# Patient Record
Sex: Female | Born: 1970 | Race: White | Hispanic: No | Smoking: Never smoker
Health system: Southern US, Community
[De-identification: ages and names within clinical notes are randomized; demographics above are authoritative.]

## PROBLEM LIST (undated history)

## (undated) DIAGNOSIS — K219 Gastro-esophageal reflux disease without esophagitis: Secondary | ICD-10-CM

## (undated) DIAGNOSIS — R519 Headache, unspecified: Secondary | ICD-10-CM

## (undated) DIAGNOSIS — J309 Allergic rhinitis, unspecified: Secondary | ICD-10-CM

## (undated) DIAGNOSIS — F32A Depression, unspecified: Secondary | ICD-10-CM

## (undated) DIAGNOSIS — Z9889 Other specified postprocedural states: Secondary | ICD-10-CM

## (undated) DIAGNOSIS — M25519 Pain in unspecified shoulder: Secondary | ICD-10-CM

## (undated) DIAGNOSIS — R5383 Other fatigue: Secondary | ICD-10-CM

## (undated) DIAGNOSIS — F419 Anxiety disorder, unspecified: Secondary | ICD-10-CM

## (undated) DIAGNOSIS — J302 Other seasonal allergic rhinitis: Secondary | ICD-10-CM

## (undated) DIAGNOSIS — E781 Pure hyperglyceridemia: Secondary | ICD-10-CM

## (undated) DIAGNOSIS — D649 Anemia, unspecified: Secondary | ICD-10-CM

## (undated) DIAGNOSIS — G4700 Insomnia, unspecified: Secondary | ICD-10-CM

## (undated) HISTORY — PX: OVARIAN CYST REMOVAL: SHX89

---

## 2008-04-28 ENCOUNTER — Ambulatory Visit: Payer: Self-pay

## 2010-10-17 ENCOUNTER — Ambulatory Visit: Payer: Self-pay | Admitting: Family Medicine

## 2011-11-09 ENCOUNTER — Ambulatory Visit: Payer: Self-pay | Admitting: Obstetrics and Gynecology

## 2011-11-14 ENCOUNTER — Inpatient Hospital Stay: Payer: Self-pay | Admitting: Obstetrics and Gynecology

## 2011-11-17 LAB — PATHOLOGY REPORT

## 2012-01-17 ENCOUNTER — Ambulatory Visit: Payer: Self-pay | Admitting: Obstetrics and Gynecology

## 2013-01-18 ENCOUNTER — Ambulatory Visit: Payer: Self-pay | Admitting: Obstetrics and Gynecology

## 2013-01-23 ENCOUNTER — Ambulatory Visit: Payer: Self-pay | Admitting: Obstetrics and Gynecology

## 2013-09-16 ENCOUNTER — Ambulatory Visit: Payer: Self-pay | Admitting: Specialist

## 2014-01-30 ENCOUNTER — Ambulatory Visit: Payer: Self-pay | Admitting: Family Medicine

## 2014-08-20 ENCOUNTER — Ambulatory Visit: Payer: Self-pay | Admitting: Family Medicine

## 2015-03-22 NOTE — Discharge Summary (Signed)
PATIENT NAME:  Teresa Rodriguez, Teresa Rodriguez MR#:  [id number] DATE OF BIRTH:  10-29-1971  DATE OF ADMISSION:  11/14/2011 DATE OF DISCHARGE:  11/17/2011   HOSPITAL COURSE: The patient underwent an exploratory laparotomy, right salpingo-oophorectomy for a dermoid cyst. Postoperatively, the patient did well. The patient was advanced to a regular diet on postoperative day one, was passing flatus on postoperative day one, ambulating. Postoperative day one hematocrit was 29.5%, BUN and creatinine of 6 and 0.7. The patient is discharged to home in good condition.   DISCHARGE MEDICATIONS:  1. Percocet 5/325, 1 to 2 tablets every 4 hours. 2. Colace 100 mg daily. 3. Naprosyn 500 mg b.i.d.     DISCHARGE INSTRUCTIONS: The patient will follow up with Dr. Ouida Sills in two weeks. Staples removed on discharge, Steri-Strips applied. The patient will return before two weeks if she has nausea, vomiting, increase in abdominal pain.   ____________________________ Boykin Nearing, MD tjs:cbb D: 11/17/2011 09:29:13 ET T: 11/17/2011 11:36:16 ET JOB#: [id number]  cc: Boykin Nearing, MD, <Dictator> Boykin Nearing MD ELECTRONICALLY SIGNED 11/29/2011 11:55

## 2016-06-17 ENCOUNTER — Other Ambulatory Visit: Payer: Self-pay | Admitting: Family Medicine

## 2016-06-17 DIAGNOSIS — Z1231 Encounter for screening mammogram for malignant neoplasm of breast: Secondary | ICD-10-CM

## 2016-06-22 ENCOUNTER — Ambulatory Visit
Admission: RE | Admit: 2016-06-22 | Discharge: 2016-06-22 | Disposition: A | Discharge status: Home / Self Care | Payer: BC Managed Care – PPO | Source: Ambulatory Visit | Attending: Family Medicine | Admitting: Family Medicine

## 2016-06-22 ENCOUNTER — Other Ambulatory Visit: Payer: Self-pay | Admitting: Family Medicine

## 2016-06-22 DIAGNOSIS — Z1231 Encounter for screening mammogram for malignant neoplasm of breast: Secondary | ICD-10-CM

## 2016-06-27 ENCOUNTER — Other Ambulatory Visit: Payer: Self-pay | Admitting: Family Medicine

## 2016-06-27 DIAGNOSIS — N6489 Other specified disorders of breast: Secondary | ICD-10-CM

## 2016-06-30 ENCOUNTER — Ambulatory Visit
Admission: RE | Admit: 2016-06-30 | Discharge: 2016-06-30 | Disposition: A | Discharge status: Home / Self Care | Payer: BC Managed Care – PPO | Source: Ambulatory Visit | Attending: Family Medicine | Admitting: Family Medicine

## 2016-06-30 DIAGNOSIS — N6489 Other specified disorders of breast: Secondary | ICD-10-CM

## 2017-11-15 ENCOUNTER — Other Ambulatory Visit: Payer: Self-pay | Admitting: Obstetrics and Gynecology

## 2017-11-15 DIAGNOSIS — N83292 Other ovarian cyst, left side: Secondary | ICD-10-CM

## 2017-11-22 ENCOUNTER — Ambulatory Visit
Admission: RE | Admit: 2017-11-22 | Discharge: 2017-11-22 | Disposition: A | Discharge status: Home / Self Care | Payer: BC Managed Care – PPO | Source: Ambulatory Visit | Attending: Obstetrics and Gynecology | Admitting: Obstetrics and Gynecology

## 2017-11-22 ENCOUNTER — Encounter: Payer: Self-pay | Admitting: Radiology

## 2017-11-22 DIAGNOSIS — D259 Leiomyoma of uterus, unspecified: Secondary | ICD-10-CM | POA: Insufficient documentation

## 2017-11-22 DIAGNOSIS — N83292 Other ovarian cyst, left side: Secondary | ICD-10-CM

## 2018-11-28 ENCOUNTER — Ambulatory Visit
Admission: EM | Admit: 2018-11-28 | Discharge: 2018-11-28 | Disposition: A | Discharge status: Home / Self Care | Payer: BC Managed Care – PPO | Attending: Family Medicine | Admitting: Family Medicine

## 2018-11-28 ENCOUNTER — Encounter: Payer: Self-pay | Admitting: Emergency Medicine

## 2018-11-28 ENCOUNTER — Other Ambulatory Visit: Payer: Self-pay

## 2018-11-28 DIAGNOSIS — H1132 Conjunctival hemorrhage, left eye: Secondary | ICD-10-CM | POA: Diagnosis not present

## 2018-11-28 HISTORY — DX: Pure hyperglyceridemia: E78.1

## 2018-11-28 HISTORY — DX: Other seasonal allergic rhinitis: J30.2

## 2018-11-28 NOTE — ED Triage Notes (Signed)
Patient in today c/o left eye being blood shot since this morning. Patient denies any crustiness of the eye, but does state that she has been around someone diagnosed with pink eye.

## 2018-11-28 NOTE — ED Provider Notes (Signed)
[facility]    CSN: [id number] Arrival date & time: 11/28/18  1027  History   Chief Complaint Chief Complaint  Patient presents with  . blood shot eye    left   HPI  48 year old female presents with the above complaint.  Patient woke up this morning and noticed redness of the left eye.  No crusting or discharge.  No visual changes.  No reports of pain.  Patient states that she has been around somebody with pinkeye and is got her concerned.  No medications or interventions tried.  No other associated symptoms.  No other complaints.  PMH, Surgical Hx, Family Hx, Social History reviewed and updated as below.  Past Medical History:  Diagnosis Date  . Hypertriglyceridemia   . Seasonal allergies    Past Surgical History:  Procedure Laterality Date  . OVARIAN CYST REMOVAL Right    OB History   No obstetric history on file.    Home Medications    Prior to Admission medications   Medication Sig Start Date End Date Taking? Authorizing Provider  Cetirizine HCl 10 MG CAPS Take by mouth.   Yes [provider]  clonazePAM (KLONOPIN) 0.5 MG tablet Take by mouth. 08/13/18  Yes [provider]  olopatadine (PATANOL) 0.1 % ophthalmic solution Apply to eye. 08/13/18 08/13/19 Yes [provider]   Family History Family History  Problem Relation Age of Onset  . Hyperlipidemia Mother   . Pancreatic cancer Father    Social History Social History   Tobacco Use  . Smoking status: Never Smoker  . Smokeless tobacco: Never Used  Substance Use Topics  . Alcohol use: Yes    Comment: occassional  . Drug use: Never   Allergies   Cyclobenzaprine   Review of Systems Review of Systems  Constitutional: Negative.   Eyes: Positive for redness. Negative for visual disturbance.   Physical Exam Triage Vital Signs ED Triage Vitals  Enc Vitals Group     BP 11/28/18 1010 100/67     Pulse Rate 11/28/18 1010 81     Resp 11/28/18 1010 16     Temp  11/28/18 1010 98 F (36.7 C)     Temp Source 11/28/18 1010 Oral     SpO2 11/28/18 1010 100 %     Weight 11/28/18 1011 140 lb (63.5 kg)     Height 11/28/18 1011 5\' 4"  (1.626 m)     Head Circumference --      Peak Flow --      Pain Score 11/28/18 1011 0     Pain Loc --      Pain Edu? --      Excl. in [place]? --    Updated Vital Signs BP 100/67 (BP Location: Right Arm)   Pulse 81   Temp 98 F (36.7 C) (Oral)   Resp 16   Ht 5\' 4"  (1.626 m)   Wt 63.5 kg   LMP 10/29/2018 (Approximate)   SpO2 100%   BMI 24.03 kg/m   Visual Acuity Right Eye Distance: 20/20(corrected) Left Eye Distance: 20/20(corrected) Bilateral Distance: 20/20(corrected)  Right Eye Near:   Left Eye Near:    Bilateral Near:     Physical Exam Constitutional:      General: She is not in acute distress. HENT:     Head: Normocephalic and atraumatic.     Nose: Nose normal.  Eyes:     General:        Right eye: No discharge.  Left eye: No discharge.     Comments: Left eye with subconjunctival hemorrhage.  No discharge.  Pulmonary:     Effort: Pulmonary effort is normal. No respiratory distress.  Neurological:     Mental Status: She is alert.  Psychiatric:        Mood and Affect: Mood normal.        Behavior: Behavior normal.    UC Treatments / Results  Labs (all labs ordered are listed, but only abnormal results are displayed) Labs Reviewed - No data to display  EKG None  Radiology No results found.  Procedures Procedures (including critical care time)  Medications Ordered in UC Medications - No data to display  Initial Impression / Assessment and Plan / UC Course  I have reviewed the triage vital signs and the nursing notes.  Pertinent labs & imaging results that were available during my care of the patient were reviewed by me and considered in my medical decision making (see chart for details).    48 year old female presents with subconjunctival hemorrhage.  Benign and self  resolving/self-limiting.  Supportive care.  Final Clinical Impressions(s) / UC Diagnoses   Final diagnoses:  Subconjunctival hemorrhage of left eye   Discharge Instructions   None    ED Prescriptions    None     Controlled Substance Prescriptions [place] Controlled Substance Registry consulted? Not Applicable   Coral Spikes, DO 11/28/18 1034

## 2019-09-23 ENCOUNTER — Other Ambulatory Visit: Payer: Self-pay | Admitting: Obstetrics and Gynecology

## 2019-09-23 DIAGNOSIS — Z1231 Encounter for screening mammogram for malignant neoplasm of breast: Secondary | ICD-10-CM

## 2019-12-19 ENCOUNTER — Inpatient Hospital Stay: Admission: RE | Admit: 2019-12-19 | Payer: BC Managed Care – PPO | Source: Ambulatory Visit

## 2020-02-04 ENCOUNTER — Other Ambulatory Visit: Payer: Self-pay

## 2020-02-04 ENCOUNTER — Ambulatory Visit
Admission: RE | Admit: 2020-02-04 | Discharge: 2020-02-04 | Disposition: A | Discharge status: Home / Self Care | Payer: Managed Care, Other (non HMO) | Source: Ambulatory Visit | Attending: Obstetrics and Gynecology | Admitting: Obstetrics and Gynecology

## 2020-02-04 DIAGNOSIS — Z1231 Encounter for screening mammogram for malignant neoplasm of breast: Secondary | ICD-10-CM

## 2020-06-28 DIAGNOSIS — U071 COVID-19: Secondary | ICD-10-CM

## 2020-06-28 HISTORY — DX: COVID-19: U07.1

## 2020-07-26 ENCOUNTER — Ambulatory Visit
Admission: EM | Admit: 2020-07-26 | Discharge: 2020-07-26 | Disposition: A | Discharge status: Home / Self Care | Payer: Managed Care, Other (non HMO) | Attending: Emergency Medicine | Admitting: Emergency Medicine

## 2020-07-26 ENCOUNTER — Other Ambulatory Visit: Payer: Self-pay

## 2020-07-26 DIAGNOSIS — R059 Cough, unspecified: Secondary | ICD-10-CM

## 2020-07-26 DIAGNOSIS — R05 Cough: Secondary | ICD-10-CM | POA: Diagnosis not present

## 2020-07-26 MED ORDER — BENZONATATE 100 MG PO CAPS: 100.0000 mg | ORAL_CAPSULE | Freq: Three times a day (TID) | ORAL | 0 refills | Status: AC

## 2020-07-26 NOTE — Discharge Instructions (Signed)
Take the [facility] as needed for your cough.    Follow up with your primary care provider if your symptoms are not improving.

## 2020-07-26 NOTE — ED Provider Notes (Signed)
[facility]    CSN: [id number] Arrival date & time: 07/26/20  1035      History   Chief Complaint Chief Complaint  Patient presents with  . Cough    HPI Teresa Rodriguez is a 49 y.o. female.   Patient presents with nonproductive cough and chest tightness when she takes a deep breath since being diagnosed with COVID on 07/05/2020.  She reports she has not had any fever since being diagnosed.  She denies weakness, ear pain, sore throat, congestion, chest pain, shortness of breath, abdominal pain, vomiting, diarrhea, rash, or other symptoms.  The history is provided by the patient.    Past Medical History:  Diagnosis Date  . Hypertriglyceridemia   . Seasonal allergies     There are no problems to display for this patient.   Past Surgical History:  Procedure Laterality Date  . OVARIAN CYST REMOVAL Right     OB History   No obstetric history on file.      Home Medications    Prior to Admission medications   Medication Sig Start Date End Date Taking? Authorizing Provider  Cetirizine HCl 10 MG CAPS Take by mouth.   Yes [provider]  clonazePAM (KLONOPIN) 0.5 MG tablet Take by mouth. 08/13/18  Yes [provider]  benzonatate (TESSALON) 100 MG capsule Take 1 capsule (100 mg total) by mouth every 8 (eight) hours. 07/26/20   Sharion Balloon, NP    Family History Family History  Problem Relation Age of Onset  . Hyperlipidemia Mother   . Pancreatic cancer Father   . Breast cancer Neg Hx     Social History Social History   Tobacco Use  . Smoking status: Never Smoker  . Smokeless tobacco: Never Used  Vaping Use  . Vaping Use: Never used  Substance Use Topics  . Alcohol use: Yes    Comment: occasional  . Drug use: Never     Allergies   Cyclobenzaprine   Review of Systems Review of Systems  Constitutional: Negative for chills and fever.  HENT: Negative for congestion, ear pain and sore throat.   Eyes: Negative for pain and  visual disturbance.  Respiratory: Positive for cough and chest tightness. Negative for shortness of breath.   Cardiovascular: Negative for chest pain and palpitations.  Gastrointestinal: Negative for abdominal pain, diarrhea and vomiting.  Genitourinary: Negative for dysuria and hematuria.  Musculoskeletal: Negative for arthralgias and back pain.  Skin: Negative for color change and rash.  Neurological: Negative for seizures and syncope.  All other systems reviewed and are negative.    Physical Exam Triage Vital Signs ED Triage Vitals  Enc Vitals Group     BP      Pulse      Resp      Temp      Temp src      SpO2      Weight      Height      Head Circumference      Peak Flow      Pain Score      Pain Loc      Pain Edu?      Excl. in [place]?    No data found.  Updated Vital Signs BP 132/76 (BP Location: Left Arm)   Pulse 84   Temp 98.6 F (37 C) (Oral)   Resp 18   Ht 5\' 4"  (1.626 m)   Wt 140 lb (63.5 kg)   LMP 07/26/2020  SpO2 100%   BMI 24.03 kg/m   Visual Acuity Right Eye Distance:   Left Eye Distance:   Bilateral Distance:    Right Eye Near:   Left Eye Near:    Bilateral Near:     Physical Exam Vitals and nursing note reviewed.  Constitutional:      General: She is not in acute distress.    Appearance: She is well-developed. She is not ill-appearing.  HENT:     Head: Normocephalic and atraumatic.     Right Ear: Tympanic membrane normal.     Left Ear: Tympanic membrane normal.     Nose: Nose normal.     Mouth/Throat:     Mouth: Mucous membranes are moist.     Pharynx: Oropharynx is clear.  Eyes:     Conjunctiva/sclera: Conjunctivae normal.  Cardiovascular:     Rate and Rhythm: Normal rate and regular rhythm.     Heart sounds: No murmur heard.   Pulmonary:     Effort: Pulmonary effort is normal. No respiratory distress.     Breath sounds: Normal breath sounds. No wheezing, rhonchi or rales.  Abdominal:     Palpations: Abdomen is soft.      Tenderness: There is no abdominal tenderness. There is no guarding or rebound.  Musculoskeletal:     Cervical back: Neck supple.  Skin:    General: Skin is warm and dry.     Findings: No rash.  Neurological:     General: No focal deficit present.     Mental Status: She is alert and oriented to person, place, and time.     Gait: Gait normal.  Psychiatric:        Mood and Affect: Mood normal.        Behavior: Behavior normal.      UC Treatments / Results  Labs (all labs ordered are listed, but only abnormal results are displayed) Labs Reviewed - No data to display  EKG   Radiology No results found.  Procedures Procedures (including critical care time)  Medications Ordered in UC Medications - No data to display  Initial Impression / Assessment and Plan / UC Course  I have reviewed the triage vital signs and the nursing notes.  Pertinent labs & imaging results that were available during my care of the patient were reviewed by me and considered in my medical decision making (see chart for details).   Cough.  Treating with Tessalon Perles.  Patient is well-appearing and her exam is reassuring.  Instructed patient to follow-up with her PCP if her symptoms are not improving.  Patient agrees to plan of care.   Final Clinical Impressions(s) / UC Diagnoses   Final diagnoses:  Cough     Discharge Instructions     Take the Tessalon Perles as needed for your cough.    Follow up with your primary care provider if your symptoms are not improving.       ED Prescriptions    Medication Sig Dispense Auth. Provider   benzonatate (TESSALON) 100 MG capsule Take 1 capsule (100 mg total) by mouth every 8 (eight) hours. 21 capsule Sharion Balloon, NP     PDMP not reviewed this encounter.   Sharion Balloon, NP 07/26/20 1201

## 2020-07-26 NOTE — ED Triage Notes (Signed)
Patient states that she was positive for covid on 8/8. States that she has since tested negative and has returned to work. Reports that she has continued to have a cough and feels like it has been worsening recently. States that it seems to be worse after walking.

## 2020-10-13 ENCOUNTER — Other Ambulatory Visit: Payer: Self-pay | Admitting: Orthopedic Surgery

## 2020-10-13 ENCOUNTER — Other Ambulatory Visit (HOSPITAL_COMMUNITY): Payer: Self-pay | Admitting: Orthopedic Surgery

## 2020-10-13 DIAGNOSIS — M25512 Pain in left shoulder: Secondary | ICD-10-CM

## 2020-10-13 DIAGNOSIS — G8929 Other chronic pain: Secondary | ICD-10-CM

## 2020-10-19 ENCOUNTER — Other Ambulatory Visit: Payer: Self-pay

## 2020-10-19 ENCOUNTER — Encounter: Payer: Self-pay | Admitting: Emergency Medicine

## 2020-10-19 ENCOUNTER — Ambulatory Visit
Admission: EM | Admit: 2020-10-19 | Discharge: 2020-10-19 | Disposition: A | Discharge status: Home / Self Care | Payer: Managed Care, Other (non HMO) | Attending: Physician Assistant | Admitting: Physician Assistant

## 2020-10-19 DIAGNOSIS — R682 Dry mouth, unspecified: Secondary | ICD-10-CM | POA: Diagnosis not present

## 2020-10-19 DIAGNOSIS — K148 Other diseases of tongue: Secondary | ICD-10-CM

## 2020-10-19 MED ORDER — NYSTATIN 100000 UNIT/ML MT SUSP: OROMUCOSAL | 0 refills | Status: DC

## 2020-10-19 NOTE — ED Provider Notes (Signed)
[facility]    CSN: [id number] Arrival date & time: 10/19/20  1314      History   Chief Complaint Chief Complaint  Patient presents with  . white on tongue  . 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  Patient says that her tongue does not hurt but it feels weird.  She says the looks like her tongue is white.  Patient states that before the symptoms started she had congestion and sore throat.  She says that her PCP treated her with cefdinir which she recently finished.  Patient admits to personal history of Covid a couple months ago and states that she fully recovered.  Patient denies any sore throat or other symptoms at this point.  No associated fever.  Patient denies any fatigue, body aches, chest pain or breathing difficulty.  No nausea or vomiting.  Patient says she feels well.  Past medical history significant for elevated triglycerides and iron deficiency anemia.  Patient says that her anemia is not that bad.  Patient denies use of corticosteroid inhalers.  Denies taking any corticosteroids.  Denies being immunocompromised.  Patient has no other concerns today.  HPI  Past Medical History:  Diagnosis Date  . Hypertriglyceridemia   . Seasonal allergies     There are no problems to display for this patient.   Past Surgical History:  Procedure Laterality Date  . OVARIAN CYST REMOVAL Right     OB History   No obstetric history on file.      Home Medications    Prior to Admission medications   Medication Sig Start Date End Date Taking? Authorizing Provider  Cetirizine HCl 10 MG CAPS Take by mouth.   Yes [provider]  clonazePAM (KLONOPIN) 0.5 MG tablet Take by mouth. 08/13/18  Yes [provider]  benzonatate (TESSALON) 100 MG capsule Take 1 capsule (100 mg total) by mouth every 8 (eight) hours. 07/26/20   Sharion Balloon,  NP  nystatin (MYCOSTATIN) 100000 UNIT/ML suspension Swish 5 mL in mouth and maintaining mouth as long as you can and then spit 4 times daily x10 days 10/19/20   Danton Clap, PA-C    Family History Family History  Problem Relation Age of Onset  . Hyperlipidemia Mother   . Pancreatic cancer Father   . Hypertension Father   . Breast cancer Neg Hx     Social History Social History   Tobacco Use  . Smoking status: Never Smoker  . Smokeless tobacco: Never Used  Vaping Use  . Vaping Use: Never used  Substance Use Topics  . Alcohol use: Yes    Comment: occasional  . Drug use: Never     Allergies   Cyclobenzaprine   Review of Systems Review of Systems  Constitutional: Negative for chills, diaphoresis, fatigue and fever.  HENT: Negative for congestion, facial swelling, rhinorrhea and sore throat.   Respiratory: Negative for cough and shortness of breath.   Cardiovascular: Negative for chest pain.  Gastrointestinal: Negative for abdominal pain, nausea and vomiting.  Musculoskeletal: Negative for arthralgias and myalgias.  Skin: Positive for color change. Negative for rash.  Neurological: Negative for weakness and headaches.  Hematological: Negative for adenopathy.     Physical Exam Triage Vital Signs ED Triage Vitals  Enc Vitals Group     BP 10/19/20 1326 (!) 118/56  Pulse Rate 10/19/20 1326 81     Resp 10/19/20 1326 18     Temp 10/19/20 1326 98.4 F (36.9 C)     Temp Source 10/19/20 1326 Oral     SpO2 10/19/20 1326 100 %     Weight 10/19/20 1325 148 lb (67.1 kg)     Height 10/19/20 1325 5\' 3"  (1.6 m)     Head Circumference --      Peak Flow --      Pain Score 10/19/20 1325 0     Pain Loc --      Pain Edu? --      Excl. in [place]? --    No data found.  Updated Vital Signs BP (!) 118/56 (BP Location: Left Arm)   Pulse 81   Temp 98.4 F (36.9 C) (Oral)   Resp 18   Ht 5\' 3"  (1.6 m)   Wt 148 lb (67.1 kg)   LMP 10/15/2020 (Approximate)   SpO2 100%    BMI 26.22 kg/m       Physical Exam Vitals and nursing note reviewed.  Constitutional:      General: She is not in acute distress.    Appearance: Normal appearance. She is not ill-appearing or toxic-appearing.  HENT:     Head: Normocephalic and atraumatic.     Nose: Nose normal.     Mouth/Throat:     Mouth: Mucous membranes are moist.     Pharynx: Oropharynx is clear.     Comments: Tongue is dry and pale.  No swelling noted.  Slight white coating on tongue. Eyes:     General: No scleral icterus.       Right eye: No discharge.        Left eye: No discharge.     Conjunctiva/sclera: Conjunctivae normal.  Cardiovascular:     Rate and Rhythm: Normal rate and regular rhythm.     Heart sounds: Normal heart sounds.  Pulmonary:     Effort: Pulmonary effort is normal. No respiratory distress.     Breath sounds: Normal breath sounds.  Musculoskeletal:     Cervical back: Neck supple.  Skin:    General: Skin is dry.  Neurological:     General: No focal deficit present.     Mental Status: She is alert. Mental status is at baseline.     Motor: No weakness.     Gait: Gait normal.  Psychiatric:        Mood and Affect: Mood normal.        Behavior: Behavior normal.        Thought Content: Thought content normal.      UC Treatments / Results  Labs (all labs ordered are listed, but only abnormal results are displayed) Labs Reviewed - No data to display  EKG   Radiology No results found.  Procedures Procedures (including critical care time)  Medications Ordered in UC Medications - No data to display  Initial Impression / Assessment and Plan / UC Course  I have reviewed the triage vital signs and the nursing notes.  Pertinent labs & imaging results that were available during my care of the patient were reviewed by me and considered in my medical decision making (see chart for details).    49 year old female presenting for discomfort of tongue with associated dry mouth and  discoloration of tongue.  On exam her tongue looks dry and has the feet white coating.  This does not appear to be exactly consistent with thrush  but that is patient's concern.  Advised her to increase fluids and suck on hard candies to stimulate her salivary glands.  Treating with nystatin suspension to see if that helps at this point.  Advised that if this does not get better she will need to follow-up with her PCP for further work-up including labs to assess for vitamin deficiencies and check her iron.  I would check her CBC, but she has no other symptoms and says that she feels well.  Final Clinical Impressions(s) / UC Diagnoses   Final diagnoses:  Dry mouth  Tongue discoloration     Discharge Instructions     Your tongue appears very dry and is pale.  You should increase your fluid intake.  Consider sucking on hard candies and sour candies to stimulate your salivary glands.  Consider use of humidifier as well.  As discussed, your condition does not look like thrush, recent antibiotic use can increase your risk of thrush.  Try nystatin mouthwash at this point to see if there is any fungal component to your tongue swelling and discoloration.  Follow-up with PCP if this is not getting better over the next week.  You may need to have certain labs checked to assess for vitamin deficiencies and have your iron level checked.    ED Prescriptions    Medication Sig Dispense Auth. Provider   nystatin (MYCOSTATIN) 100000 UNIT/ML suspension Swish 5 mL in mouth and maintaining mouth as long as you can and then spit 4 times daily x10 days 150 mL Danton Clap, PA-C     PDMP not reviewed this encounter.   Danton Clap, PA-C 10/19/20 1433

## 2020-10-19 NOTE — ED Triage Notes (Signed)
Patient in today c/o white on her tongue and dry mouth x 2 days. Patient finished Ceftin on 10/16/20. Patient had covid August 2021 and tested negative for flu, strep, and covid on ~10/07/20.

## 2020-10-19 NOTE — Discharge Instructions (Signed)
Your tongue appears very dry and is pale.  You should increase your fluid intake.  Consider sucking on hard candies and sour candies to stimulate your salivary glands.  Consider use of humidifier as well.  As discussed, your condition does not look like thrush, recent antibiotic use can increase your risk of thrush.  Try nystatin mouthwash at this point to see if there is any fungal component to your tongue swelling and discoloration.  Follow-up with PCP if this is not getting better over the next week.  You may need to have certain labs checked to assess for vitamin deficiencies and have your iron level checked.

## 2020-10-24 ENCOUNTER — Ambulatory Visit: Payer: Managed Care, Other (non HMO)

## 2020-11-12 ENCOUNTER — Ambulatory Visit
Admission: RE | Admit: 2020-11-12 | Discharge: 2020-11-12 | Disposition: A | Discharge status: Home / Self Care | Payer: Managed Care, Other (non HMO) | Source: Ambulatory Visit | Attending: Orthopedic Surgery | Admitting: Orthopedic Surgery

## 2020-11-12 ENCOUNTER — Other Ambulatory Visit: Payer: Self-pay

## 2020-11-12 DIAGNOSIS — M25512 Pain in left shoulder: Secondary | ICD-10-CM | POA: Diagnosis present

## 2020-11-23 ENCOUNTER — Other Ambulatory Visit: Payer: Self-pay | Admitting: Orthopedic Surgery

## 2020-12-02 ENCOUNTER — Encounter
Admission: RE | Admit: 2020-12-02 | Discharge: 2020-12-02 | Disposition: A | Discharge status: Home / Self Care | Payer: Managed Care, Other (non HMO) | Source: Ambulatory Visit | Attending: Orthopedic Surgery | Admitting: Orthopedic Surgery

## 2020-12-02 ENCOUNTER — Other Ambulatory Visit: Payer: Self-pay

## 2020-12-02 HISTORY — DX: Headache, unspecified: R51.9

## 2020-12-02 HISTORY — DX: Gastro-esophageal reflux disease without esophagitis: K21.9

## 2020-12-02 NOTE — Patient Instructions (Signed)
Your procedure is scheduled on: Monday 12/07/20.  Report to THE FIRST FLOOR REGISTRATION DESK IN THE MEDICAL MALL ON THE MORNING OF SURGERY FIRST, THEN YOU WILL CHECK IN AT THE SURGERY INFORMATION DESK LOCATED OUTSIDE THE SAME DAY SURGERY DEPARTMENT LOCATED ON 2ND FLOOR MEDICAL MALL ENTRANCE.  To find out your arrival time please call [phone] between 1PM - 3PM on Friday 12/04/20.   Remember: Instructions that are not followed completely may result in serious medical risk, up to and including death, or upon the discretion of your surgeon and anesthesiologist your surgery may need to be rescheduled.     __X__ 1. Do not eat food after midnight the night before your procedure.                 No gum chewing or hard candies. You may drink clear liquids up to 2 hours                 before you are scheduled to arrive for your surgery- DO NOT drink clear                 liquids within 2 hours of the start of your surgery.                 Clear Liquids include:  water, apple juice without pulp, clear carbohydrate                 drink such as Clearfast or Gatorade, Black Coffee or Tea (Do not add                 milk or creamer to coffee or tea).  **Dr. Allena Katz would like for you to finish the Pre-surgery Ensure 2 hours prior to your arrival time on the morning of surgery.**   __X__2.  On the morning of surgery brush your teeth with toothpaste and water, you may rinse your mouth with mouthwash if you wish.  Do not swallow any toothpaste or mouthwash.    __X__ 3.  No Alcohol for 24 hours before or after surgery.  __X__ 4.  Do Not Smoke or use e-cigarettes For 24 Hours Prior to Your Surgery.                 Do not use any chewable tobacco products for at least 6 hours prior to                 surgery.  __X__5.  Notify your doctor if there is any change in your medical condition      (cold, fever, infections).      Do NOT wear jewelry, make-up, hairpins, clips or nail polish. Do NOT wear  lotions, powders, or perfumes.  Do NOT shave 48 hours prior to surgery. Men may shave face and neck. Do NOT bring valuables to the hospital.     [facility] is not responsible for any belongings or valuables.   Contacts, dentures/partials or body piercings may not be worn into surgery. Bring a case for your contacts, glasses or hearing aids, a denture cup will be supplied.    Patients discharged the day of surgery will not be allowed to drive home.     __X__ Take these medicines the morning of surgery with A SIP OF WATER:     1. cetirizine (ZYRTEC)  2. clonazePAM (KLONOPIN) if needed     __X__ Use CHG Soap as directed.  __X__ Stop Anti-inflammatories 7 days before surgery such as Advil,  Ibuprofen, Motrin, BC or Goodies Powder, Naprosyn, Naproxen, Aleve, Aspirin, Meloxicam. May take Tylenol if needed for pain or discomfort.   __X__Do not start taking any new herbal supplements or vitamins prior to your procedure.     Wear comfortable clothing (specific to your surgery type) to the hospital.  Plan for stool softeners for home use; pain medications have a tendency to cause constipation. You can also help prevent constipation by eating foods high in fiber such as fruits and vegetables and drinking plenty of fluids as your diet allows.  After surgery, you can prevent lung complications by doing breathing exercises.Take deep breaths and cough every 1-2 hours. Your doctor may order a device called an Incentive Spirometer to help you take deep breaths.  Please call the [place] Department at [phone] if you have any questions about these instructions.

## 2020-12-03 ENCOUNTER — Encounter: Payer: Self-pay | Admitting: Orthopedic Surgery

## 2020-12-03 ENCOUNTER — Other Ambulatory Visit
Admission: RE | Admit: 2020-12-03 | Discharge: 2020-12-03 | Disposition: A | Discharge status: Home / Self Care | Payer: Managed Care, Other (non HMO) | Source: Ambulatory Visit | Attending: Orthopedic Surgery | Admitting: Orthopedic Surgery

## 2020-12-03 DIAGNOSIS — Z20822 Contact with and (suspected) exposure to covid-19: Secondary | ICD-10-CM | POA: Insufficient documentation

## 2020-12-03 DIAGNOSIS — Z01812 Encounter for preprocedural laboratory examination: Secondary | ICD-10-CM | POA: Diagnosis not present

## 2020-12-03 LAB — URINALYSIS, ROUTINE W REFLEX MICROSCOPIC
Bacteria, UA: NONE SEEN
Bilirubin Urine: NEGATIVE
Glucose, UA: NEGATIVE mg/dL
Ketones, ur: NEGATIVE mg/dL
Nitrite: NEGATIVE
Protein, ur: NEGATIVE mg/dL
Specific Gravity, Urine: 1.005 (ref 1.005–1.030)
pH: 5 (ref 5.0–8.0)

## 2020-12-03 LAB — COMPREHENSIVE METABOLIC PANEL
ALT: 18 U/L (ref 0–44)
AST: 19 U/L (ref 15–41)
Albumin: 4.1 g/dL (ref 3.5–5.0)
Alkaline Phosphatase: 56 U/L (ref 38–126)
Anion gap: 9 (ref 5–15)
BUN: 11 mg/dL (ref 6–20)
CO2: 25 mmol/L (ref 22–32)
Calcium: 9 mg/dL (ref 8.9–10.3)
Chloride: 104 mmol/L (ref 98–111)
Creatinine, Ser: 0.66 mg/dL (ref 0.44–1.00)
GFR, Estimated: >60 mL/min (ref >60)
Glucose, Bld: 76 mg/dL (ref 70–99)
Potassium: 3.5 mmol/L (ref 3.5–5.1)
Sodium: 138 mmol/L (ref 135–145)
Total Bilirubin: 0.8 mg/dL (ref 0.3–1.2)
Total Protein: 7.1 g/dL (ref 6.5–8.1)

## 2020-12-03 LAB — CBC WITH DIFFERENTIAL/PLATELET
Abs Immature Granulocytes: 0.01 10*3/uL (ref 0.00–0.07)
Basophils Absolute: 0 10*3/uL (ref 0.0–0.1)
Basophils Relative: 1 %
Eosinophils Absolute: 0.1 10*3/uL (ref 0.0–0.5)
Eosinophils Relative: 2 %
HCT: 36.1 % (ref 36.0–46.0)
Hemoglobin: 12 g/dL (ref 12.0–15.0)
Immature Granulocytes: 0 %
Lymphocytes Relative: 25 %
Lymphs Abs: 1.1 10*3/uL (ref 0.7–4.0)
MCH: 29.3 pg (ref 26.0–34.0)
MCHC: 33.2 g/dL (ref 30.0–36.0)
MCV: 88.3 fL (ref 80.0–100.0)
Monocytes Absolute: 0.5 10*3/uL (ref 0.1–1.0)
Monocytes Relative: 10 %
Neutro Abs: 2.7 10*3/uL (ref 1.7–7.7)
Neutrophils Relative %: 62 %
Platelets: 201 10*3/uL (ref 150–400)
RBC: 4.09 MIL/uL (ref 3.87–5.11)
RDW: 13 % (ref 11.5–15.5)
WBC: 4.4 10*3/uL (ref 4.0–10.5)
nRBC: 0 % (ref 0.0–0.2)

## 2020-12-03 LAB — SARS CORONAVIRUS 2 (TAT 6-24 HRS): SARS Coronavirus 2: NEGATIVE

## 2020-12-07 ENCOUNTER — Ambulatory Visit: Payer: Managed Care, Other (non HMO) | Admitting: Anesthesiology

## 2020-12-07 ENCOUNTER — Encounter: Payer: Self-pay | Admitting: Orthopedic Surgery

## 2020-12-07 ENCOUNTER — Ambulatory Visit
Admission: RE | Admit: 2020-12-07 | Discharge: 2020-12-07 | Disposition: A | Discharge status: Home / Self Care | Payer: Managed Care, Other (non HMO) | Attending: Orthopedic Surgery | Admitting: Orthopedic Surgery

## 2020-12-07 ENCOUNTER — Encounter
Admission: RE | Disposition: A | Discharge status: Home / Self Care | Payer: Self-pay | Source: Home / Self Care | Attending: Orthopedic Surgery

## 2020-12-07 ENCOUNTER — Ambulatory Visit: Payer: Managed Care, Other (non HMO)

## 2020-12-07 ENCOUNTER — Other Ambulatory Visit: Payer: Self-pay

## 2020-12-07 DIAGNOSIS — M7522 Bicipital tendinitis, left shoulder: Secondary | ICD-10-CM | POA: Insufficient documentation

## 2020-12-07 DIAGNOSIS — Z791 Long term (current) use of non-steroidal anti-inflammatories (NSAID): Secondary | ICD-10-CM | POA: Diagnosis not present

## 2020-12-07 DIAGNOSIS — Y92813 Airplane as the place of occurrence of the external cause: Secondary | ICD-10-CM | POA: Diagnosis not present

## 2020-12-07 DIAGNOSIS — Z8349 Family history of other endocrine, nutritional and metabolic diseases: Secondary | ICD-10-CM | POA: Insufficient documentation

## 2020-12-07 DIAGNOSIS — M7542 Impingement syndrome of left shoulder: Secondary | ICD-10-CM | POA: Diagnosis not present

## 2020-12-07 DIAGNOSIS — Z79899 Other long term (current) drug therapy: Secondary | ICD-10-CM | POA: Diagnosis not present

## 2020-12-07 DIAGNOSIS — M75122 Complete rotator cuff tear or rupture of left shoulder, not specified as traumatic: Secondary | ICD-10-CM | POA: Insufficient documentation

## 2020-12-07 DIAGNOSIS — Z800 Family history of malignant neoplasm of digestive organs: Secondary | ICD-10-CM | POA: Insufficient documentation

## 2020-12-07 DIAGNOSIS — M795 Residual foreign body in soft tissue: Secondary | ICD-10-CM

## 2020-12-07 DIAGNOSIS — Z419 Encounter for procedure for purposes other than remedying health state, unspecified: Secondary | ICD-10-CM

## 2020-12-07 DIAGNOSIS — S43492A Other sprain of left shoulder joint, initial encounter: Secondary | ICD-10-CM | POA: Insufficient documentation

## 2020-12-07 DIAGNOSIS — Z888 Allergy status to other drugs, medicaments and biological substances status: Secondary | ICD-10-CM | POA: Insufficient documentation

## 2020-12-07 DIAGNOSIS — X500XXA Overexertion from strenuous movement or load, initial encounter: Secondary | ICD-10-CM | POA: Insufficient documentation

## 2020-12-07 HISTORY — PX: SHOULDER ARTHROSCOPY WITH ROTATOR CUFF REPAIR AND SUBACROMIAL DECOMPRESSION: SHX5686

## 2020-12-07 LAB — POCT PREGNANCY, URINE: Preg Test, Ur: NEGATIVE

## 2020-12-07 SURGERY — SHOULDER ARTHROSCOPY WITH ROTATOR CUFF REPAIR AND SUBACROMIAL DECOMPRESSION
Anesthesia: General | Laterality: Left

## 2020-12-07 MED ORDER — BUPIVACAINE HCL (PF) 0.5 % IJ SOLN
INTRAMUSCULAR | Status: DC | PRN
  Administered 2020-12-07: 20 mL via PERINEURAL

## 2020-12-07 MED ORDER — FENTANYL CITRATE (PF) 100 MCG/2ML IJ SOLN
INTRAMUSCULAR | Status: DC | PRN
Start: 2020-12-07 — End: 2020-12-07
  Administered 2020-12-07: 100 ug via INTRAVENOUS
  Administered 2020-12-07: 25 ug via INTRAVENOUS

## 2020-12-07 MED ORDER — OXYCODONE HCL 5 MG PO TABS: 5.0000 mg | ORAL_TABLET | Freq: Once | ORAL | Status: DC | PRN

## 2020-12-07 MED ORDER — DEXAMETHASONE SODIUM PHOSPHATE 4 MG/ML IJ SOLN
INTRAMUSCULAR | Status: DC | PRN
  Administered 2020-12-07: 4 mg via INTRAVENOUS

## 2020-12-07 MED ORDER — MIDAZOLAM HCL 2 MG/2ML IJ SOLN
INTRAMUSCULAR | Status: DC | PRN
  Administered 2020-12-07: 2 mg via INTRAVENOUS

## 2020-12-07 MED ORDER — CEFAZOLIN SODIUM-DEXTROSE 2-4 GM/100ML-% IV SOLN
2.0000 g | INTRAVENOUS | Status: AC
  Administered 2020-12-07 (×2): 2 g via INTRAVENOUS

## 2020-12-07 MED ORDER — ONDANSETRON HCL 4 MG/2ML IJ SOLN
INTRAMUSCULAR | Status: DC | PRN
  Administered 2020-12-07: 4 mg via INTRAVENOUS

## 2020-12-07 MED ORDER — SCOPOLAMINE 1 MG/3DAYS TD PT72
1.0000 | MEDICATED_PATCH | Freq: Once | TRANSDERMAL | Status: DC
  Administered 2020-12-07: 1.5 mg via TRANSDERMAL

## 2020-12-07 MED ORDER — ONDANSETRON HCL 4 MG/2ML IJ SOLN
4.0000 mg | Freq: Once | INTRAMUSCULAR | Status: AC | PRN
  Administered 2020-12-07: 4 mg via INTRAVENOUS

## 2020-12-07 MED ORDER — PROPOFOL 10 MG/ML IV BOLUS
INTRAVENOUS | Status: DC | PRN
  Administered 2020-12-07: 150 mg via INTRAVENOUS

## 2020-12-07 MED ORDER — ASPIRIN EC 325 MG PO TBEC: 325.0000 mg | DELAYED_RELEASE_TABLET | Freq: Every day | ORAL | 0 refills | Status: AC

## 2020-12-07 MED ORDER — OXYCODONE HCL 5 MG PO TABS: 5.0000 mg | ORAL_TABLET | ORAL | 0 refills | Status: DC | PRN

## 2020-12-07 MED ORDER — ACETAMINOPHEN 160 MG/5ML PO SOLN: 325.0000 mg | ORAL | Status: DC | PRN

## 2020-12-07 MED ORDER — ONDANSETRON 4 MG PO TBDP: 4.0000 mg | ORAL_TABLET | Freq: Three times a day (TID) | ORAL | 0 refills | Status: AC | PRN

## 2020-12-07 MED ORDER — AMISULPRIDE (ANTIEMETIC) 5 MG/2ML IV SOLN
5.0000 mg | Freq: Once | INTRAVENOUS | Status: AC
  Administered 2020-12-07: 5 mg via INTRAVENOUS

## 2020-12-07 MED ORDER — BUPIVACAINE LIPOSOME 1.3 % IJ SUSP
INTRAMUSCULAR | Status: DC | PRN
  Administered 2020-12-07: 20 mL via PERINEURAL

## 2020-12-07 MED ORDER — SODIUM CHLORIDE 0.9 % IR SOLN
Status: DC | PRN
  Administered 2020-12-07 (×28): 3000 mL

## 2020-12-07 MED ORDER — FENTANYL CITRATE (PF) 100 MCG/2ML IJ SOLN
25.0000 ug | INTRAMUSCULAR | Status: DC | PRN
Start: 2020-12-07 — End: 2020-12-07

## 2020-12-07 MED ORDER — ACETAMINOPHEN 325 MG PO TABS: 325.0000 mg | ORAL_TABLET | ORAL | Status: DC | PRN

## 2020-12-07 MED ORDER — ACETAMINOPHEN 500 MG PO TABS: 1000.0000 mg | ORAL_TABLET | Freq: Three times a day (TID) | ORAL | 2 refills | Status: AC

## 2020-12-07 MED ORDER — OXYCODONE HCL 5 MG/5ML PO SOLN: 5.0000 mg | Freq: Once | ORAL | Status: DC | PRN

## 2020-12-07 MED ORDER — LACTATED RINGERS IV SOLN
INTRAVENOUS | Status: DC | PRN
  Administered 2020-12-07 (×4): 1 mL

## 2020-12-07 MED ORDER — LACTATED RINGERS IV SOLN: INTRAVENOUS | Status: DC

## 2020-12-07 SURGICAL SUPPLY — 70 items
ADAPTER IRRIG TUBE 2 SPIKE SOL (ADAPTER) ×4 IMPLANT
ADH SKN CLS APL DERMABOND .7 (GAUZE/BANDAGES/DRESSINGS)
ADPR TBG 2 SPK PMP STRL ASCP (ADAPTER) ×2
ANCH SUT 2 SWLK 19.1 CLS EYLT (Anchor) ×2 IMPLANT
ANCH SUT SHRT 12.5 CANN EYLT (Anchor) ×1 IMPLANT
ANCH SUT SWLK 19.1X4.75 (Anchor) ×1 IMPLANT
ANCH SUT SWLK 19.1X5.5 CLS (Anchor) ×1 IMPLANT
ANCHOR ICONIX SPEED 2.3 (Anchor) ×2 IMPLANT
ANCHOR SUT BIO SW 4.75X19.1 (Anchor) ×2 IMPLANT
ANCHOR SUT BIOCOMP LK 2.9X12.5 (Anchor) ×2 IMPLANT
ANCHOR SWIVELOCK BIO 4.75X19.1 (Anchor) ×4 IMPLANT
ANCHOR SWIVELOCK BIO COMP (Anchor) ×2 IMPLANT
APL PRP STRL LF DISP 70% ISPRP (MISCELLANEOUS) ×1
BUR BR 5.5 12 FLUTE (BURR) ×2 IMPLANT
BUR RADIUS 4.0X18.5 (BURR) ×2 IMPLANT
CANNULA PART THRD DISP 5.75X7 (CANNULA) ×2 IMPLANT
CANNULA PARTIAL THREAD 2X7 (CANNULA) ×2 IMPLANT
CHLORAPREP W/TINT 26 (MISCELLANEOUS) ×2 IMPLANT
COOLER POLAR GLACIER W/PUMP (MISCELLANEOUS) ×2 IMPLANT
COVER LIGHT HANDLE UNIVERSAL (MISCELLANEOUS) ×4 IMPLANT
DERMABOND ADVANCED (GAUZE/BANDAGES/DRESSINGS)
DERMABOND ADVANCED .7 DNX12 (GAUZE/BANDAGES/DRESSINGS) IMPLANT
DRAPE IMP U-DRAPE 54X76 (DRAPES) ×4 IMPLANT
DRAPE INCISE IOBAN 66X45 STRL (DRAPES) ×2 IMPLANT
DRAPE SHEET LG 3/4 BI-LAMINATE (DRAPES) ×2 IMPLANT
DRAPE U-SHAPE 48X52 POLY STRL (PACKS) ×4 IMPLANT
DRSG TEGADERM 4X10 (GAUZE/BANDAGES/DRESSINGS) ×2 IMPLANT
ELECT REM PT RETURN 9FT ADLT (ELECTROSURGICAL) ×2
ELECTRODE REM PT RTRN 9FT ADLT (ELECTROSURGICAL) ×1 IMPLANT
FIBER TAPE 2MM (SUTURE) ×2 IMPLANT
GAUZE SPONGE 4X4 12PLY STRL (GAUZE/BANDAGES/DRESSINGS) ×2 IMPLANT
GAUZE XEROFORM 1X8 LF (GAUZE/BANDAGES/DRESSINGS) ×2 IMPLANT
GLOVE BIOGEL PI IND STRL 8 (GLOVE) ×1 IMPLANT
GLOVE BIOGEL PI INDICATOR 8 (GLOVE) ×1
GOWN STRL REIN 2XL XLG LVL4 (GOWN DISPOSABLE) ×2 IMPLANT
GOWN STRL REUS W/ TWL LRG LVL3 (GOWN DISPOSABLE) ×1 IMPLANT
GOWN STRL REUS W/TWL LRG LVL3 (GOWN DISPOSABLE) ×2
IV LACTATED RINGER IRRG 3000ML (IV SOLUTION) ×60
IV LR IRRIG 3000ML ARTHROMATIC (IV SOLUTION) ×30 IMPLANT
KIT DISPOSABLE PUSHLOCK 2.9MM (KITS) ×2 IMPLANT
KIT STABILIZATION SHOULDER (MISCELLANEOUS) ×2 IMPLANT
KIT TURNOVER KIT A (KITS) ×2 IMPLANT
MANIFOLD 4PT FOR NEPTUNE1 (MISCELLANEOUS) ×2 IMPLANT
MASK FACE SPIDER DISP (MASK) ×2 IMPLANT
MAT ABSORB  FLUID 56X50 GRAY (MISCELLANEOUS) ×2
MAT ABSORB FLUID 56X50 GRAY (MISCELLANEOUS) ×2 IMPLANT
NDL SAFETY ECLIPSE 18X1.5 (NEEDLE) ×1 IMPLANT
NEEDLE HYPO 18GX1.5 SHARP (NEEDLE) ×2
NEEDLE SCORPION MULTI FIRE (NEEDLE) ×4 IMPLANT
PACK ARTHROSCOPY SHOULDER (MISCELLANEOUS) ×2 IMPLANT
PAD WRAPON POLAR SHDR UNIV (MISCELLANEOUS) ×1 IMPLANT
PASSER SUT SWIFTSTITCH HIP CRT (INSTRUMENTS) ×2 IMPLANT
PENCIL SMOKE EVACUATOR (MISCELLANEOUS) ×4 IMPLANT
SET TUBE SUCT SHAVER OUTFL 24K (TUBING) ×2 IMPLANT
SET TUBE TIP INTRA-ARTICULAR (MISCELLANEOUS) ×2 IMPLANT
SUT ETHILON 3-0 (SUTURE) ×2 IMPLANT
SUT FIBERSTICK 2-0 (SUTURE) ×2 IMPLANT
SUT MNCRL 4-0 (SUTURE) ×2
SUT MNCRL 4-0 27XMFL (SUTURE) ×1
SUT VIC AB 0 CT1 36 (SUTURE) ×2 IMPLANT
SUT VIC AB 2-0 CT2 27 (SUTURE) ×2 IMPLANT
SUTURE MNCRL 4-0 27XMF (SUTURE) ×1 IMPLANT
SUTURE TAPE 1.3 40 TPR END (SUTURE) ×3 IMPLANT
SUTURETAPE 1.3 40 TPR END (SUTURE) ×6
SYR 10ML LL (SYRINGE) ×2 IMPLANT
TAPE MICROFOAM 4IN (TAPE) ×2 IMPLANT
TUBING ARTHRO INFLOW-ONLY STRL (TUBING) ×2 IMPLANT
TUBING CONNECTING 10 (TUBING) ×2 IMPLANT
WAND WEREWOLF FLOW 90D (MISCELLANEOUS) ×2 IMPLANT
WRAPON POLAR PAD SHDR UNIV (MISCELLANEOUS) ×2

## 2020-12-07 NOTE — Anesthesia Procedure Notes (Signed)
Anesthesia Regional Block: Interscalene brachial plexus block   Pre-Anesthetic Checklist: ,, timeout performed, Correct Patient, Correct Site, Correct Laterality, Correct Procedure, Correct Position, site marked, Risks and benefits discussed,  Surgical consent,  Pre-op evaluation,  At surgeon's request and post-op pain management  Laterality: Left  Prep: chloraprep       Needles:  Injection technique: Single-shot  Needle Type: Stimiplex     Needle Length: 10cm  Needle Gauge: 21     Additional Needles:   Procedures:,,,, ultrasound used (permanent image in chart),,,,  Narrative:  Start time: 12/07/2020 9:42 AM End time: 12/07/2020 9:50 AM Injection made incrementally with aspirations every 5 mL.  Performed by: Personally  Anesthesiologist: Ronelle Nigh, MD  Additional Notes: Functioning IV was confirmed and monitors applied. Ultrasound guidance: relevant anatomy identified, needle position confirmed, local anesthetic spread visualized around nerve(s)., vascular puncture avoided.  Image printed for medical record.  Negative aspiration and no paresthesias; incremental administration of local anesthetic. The patient tolerated the procedure well. Vitals signes recorded in RN notes.

## 2020-12-07 NOTE — Anesthesia Postprocedure Evaluation (Signed)
Anesthesia Post Note  Patient: EYLEEN RAWLINSON  Procedure(s) Performed: Left shoulder mini-open rotator cuff repair, arthroscopic biceps tenodesis, and subacromial decompression (Left )     Patient location during evaluation: PACU Anesthesia Type: General Level of consciousness: awake and alert and oriented Pain management: satisfactory to patient Vital Signs Assessment: post-procedure vital signs reviewed and stable Respiratory status: spontaneous breathing, nonlabored ventilation and respiratory function stable Cardiovascular status: blood pressure returned to baseline and stable Postop Assessment: Adequate PO intake and No signs of nausea or vomiting Anesthetic complications: no   No complications documented.  Raliegh Ip

## 2020-12-07 NOTE — Op Note (Signed)
SURGERY DATE: 12/07/2020   PRE-OP DIAGNOSIS:  1. Left subacromial impingement 2. Left biceps tendinopathy 3. Left rotator cuff tear  POST-OP DIAGNOSIS: 1. Left subacromial impingement 2. Left biceps tendinopathy 3. Left rotator cuff tear  PROCEDURES:  1.  Left mini open rotator cuff repair 2.  Left arthroscopic biceps tenodesis 3.  Left subacromial decompression 4.  Left extensive debridement of shoulder (glenohumeral and subacromial spaces)  SURGEON: Rosealee AlbeeSunny H. Renda Pohlman, MD  ASSISTANT: Sonny DandyJ. Todd Mundy, PA  ANESTHESIA: Gen with interscalene block  ESTIMATED BLOOD LOSS: 25cc  DRAINS:  none  TOTAL IV FLUIDS: per anesthesia   SPECIMENS: none  IMPLANTS:   - Arthrex 2.659mm Pushlock - x1 - Arthrex 4.6875mm SwiveLock - x 2 - Arthrex 5.15mm SwiveLock - x 1   OPERATIVE FINDINGS:  Examination under anesthesia: A careful examination under anesthesia was performed.  Passive range of motion was: FF: 150; ER at side: 40; ER in abduction: 90; IR in abduction: 45.  Anterior load shift: NT.  Posterior load shift: NT.  Sulcus in neutral: NT.  Sulcus in ER: NT.    Intra-operative findings: A thorough arthroscopic examination of the shoulder was performed.  The findings are: 1. Biceps tendon: tendinopathy with significant erythema at the biceps anchor  2. Superior labrum: Normal 3. Posterior labrum and capsule: Small posterior labral tear at 9:00 with posterior capsule synovitis 4. Inferior capsule and inferior recess: normal 5. Glenoid cartilage surface: Normal  6. Supraspinatus attachment: High-grade bursal sided tear of the posterior supraspinatus and anterior infraspinatus 7. Posterior rotator cuff attachment: normal 8. Humeral head articular cartilage: normal 9. Rotator interval: Significant synovitis 10: Subscapularis tendon: attachment intact 11. Anterior labrum: Mild degeneration 12. IGHL: normal  OPERATIVE REPORT:   Indications for procedure: Teresa Rodriguez is a 50 y.o. female  who suffered an injury over 3 months ago when she was lifting overhead.  She has had significant pain and difficulty with overhead motion since that time.  Clinical exam and MRI was concerning for rotator cuff tear, biceps adenopathy and subacromial impingement. After discussion of risks, benefits, and alternatives to surgery, the patient elected to proceed.  Of note, we did discuss the increased risk of postoperative adhesive capsulitis given preoperative history of this.   Procedure in detail: I identified Teresa Rodriguez in the pre-operative holding area.  I marked the operative shoulder with my initials. I reviewed the risks and benefits of the proposed surgical intervention, and the patient (and/or patient's guardian) wished to proceed.  Anesthesia was then performed with an interscalene block with Exparel.  The patient was transferred to the operative suite and placed in the beach chair position.   SCDs were placed on the lower extremities. Appropriate IV antibiotics were administered within 1 hour before incision. The operative upper extremity was then prepped and draped in standard fashion. A time out was performed confirming the correct extremity, correct patient and correct procedure.   I then created a standard posterior portal with an 11 blade. The glenohumeral joint was easily entered with a blunt trochar and the arthroscope introduced. The findings of diagnostic arthroscopy are described above. I debrided degenerative tissue including labrum, posterior capsule, and rotator interval tissue.  I also debrided and coagulated the inflamed synovium to obtain hemostasis and reduce the risk of post-operative swelling using an Arthrocare radiofrequency device.   I then turned my attention to the arthroscopic biceps tenodesis.  I used the loop n tack technique to pass a fiber tape through the biceps in  a locked fashion adjacent to the biceps anchor.  A hole for a 2.9 mm Arthrex PushLock was drilled  in the bicipital groove just superior to the subscapularis tendon insertion.  The fiber tape was loaded onto the push lock anchor and impacted into place into the previously drilled hole in the bicipital groove.  However, and attempting to cut the sutures, the anchor pulled out of the bone.  Therefore, a new suture tape was passed through the biceps tendon and was attached in the bicipital groove using a 4.75 mm SwiveLock anchor.  This appropriately secured the biceps into the bicipital groove and took it off of tension.  Next, the arthroscope was then introduced into the subacromial space. A direct lateral portal was created with an 11-blade after spinal needle localization. An extensive subacromial bursectomy was performed using a combination of the shaver and Arthrocare wand. The entire acromial undersurface was exposed and the CA ligament was subperiosteally elevated to expose the anterior acromial hook. A burr was used to create a flat anterior and lateral aspect of the acromion, converting it from a Type 2 to a Type 1 acromion. Care was made to keep the deltoid fascia intact.  There is a high-grade bursal sided tear of the posterior supraspinatus and anterior infraspinatus.  There was no significant retracted tear of the infraspinatus itself as suggested on the MRI.  Given the high-grade nature of this tear, the tear was completed.  The greater tuberosity footprint was then cleared of soft tissue and bleeding surface was created using the burr to allow for improved healing.  4 strands of suture tape were passed through the rotator cuff and placed into a lateral row anchor (4.75 mm SwiveLock).  However, on tensioning the anterior sutures cut through the bone.  I then attempted to use the repair stitch to reapproximate the anterior portion of the torn tendon.  However, after passing this and tensioning it, the entire anchor pulled out of the bone.  At this point, decision was made to convert to a mini open  procedure.  A longitudinal incision from the anterolateral acromion ~6cm in length was made overlying the raphe between the anterior and middle heads of the deltoid. The raphe was identified and it was incised. The subacromial space was identified. Any remaining bursa was excised. The rotator cuff tear was identified. It was an L-shaped tear with the long limb of the L anterior.  Decision was made to perform a double row repair.  I first attempted to place an Iconix SPEED all soft tissue anchor at the articular margin, but this also pulled out of the bone without achieving fixation.  Therefore 2 strands of suture tape were loaded onto another 4.75 mm SwiveLock anchor.  This was inserted into the same hole just off the articular margin.  This achieved appropriate fixation.  While holding the rotator cuff in reduced position, all 6 strands (4 suture tape and 2 FiberWire) were passed through the rotator cuff.  On attempting to pass one of the strands, the needle tip from the Arthrex scorpion broke off.  This tip was not visualized so significant arthroscopic fluid was flushed through the joint using bulb irrigation as well as by placing the arthroscope into the posterior portal and thoroughly irrigating out the subacromial space as well as the glenohumeral joint.  Additionally, radiographs of the left shoulder were obtained with 2 separate views.  There was no retained needle tip noted.  All 6 strands of suture passed through the  rotator cuff were then loaded onto a 5.5 mm SwiveLock anchor.  This lateral row anchor was inserted more anteriorly and distally compared to prior lateral row anchor.  This achieved appropriate fixation with excellent reduction of the rotator cuff.  The rotator cuff was stable to external and internal rotation.  The wound was thoroughly irrigated.  The deltoid split was closed with 0 Vicryl.  The subdermal layer was closed with 2-0 Vicryl.  The skin was closed with staples. The portals  were closed with 3-0 Nylon. Xeroform was applied to the incisions. A sterile dressing was applied, followed by a Polar Care sleeve and a SlingShot shoulder immobilizer/sling. The patient was awakened from anesthesia without difficulty and was transferred to the PACU in stable condition.   Of note, assistance from a PA was essential to performing the surgery.  PA was present for the entire surgery.  PA assisted with patient positioning, retraction, instrumentation, and wound closure. The surgery would have been more difficult and had longer operative time without PA assistance.   Additionally, this mini open rotator cuff repair and arthroscopic biceps tenodesis both had additional complexity compared to standard repairs.  The arthroscopic biceps tenodesis required a second implant due to original implant pulling out of softer bone.  This added approximately 20 minutes to the surgical time.  The mini open rotator cuff repair was a conversion from an attempted arthroscopic rotator cuff repair.  This occurred due to loss of fixation because of the patient's underlying lower bone density.  This added approximately 1 hour to the surgical time.   COMPLICATIONS: none  DISPOSITION: plan for discharge home after recovery in PACU   POSTOPERATIVE PLAN: Remain in sling (except hygiene and elbow/wrist/hand RoM exercises as instructed by PT) x 6 weeks and NWB for this time. PT to begin 3-4 days after surgery. Rotator cuff repair and biceps tenodesis rehab protocol.

## 2020-12-07 NOTE — Discharge Instructions (Signed)
Post-Op Instructions - Rotator Cuff Repair  1. Bracing: You will wear a shoulder immobilizer or sling for 6 weeks.   2. Driving: No driving for 3 weeks post-op. When driving, do not wear the immobilizer. Ideally, we recommend no driving for 6 weeks while sling is in place as one arm will be immobilized.   3. Activity: No active lifting for 2 months. Wrist, hand, and elbow motion only. Avoid lifting the upper arm away from the body except for hygiene. You are permitted to bend and straighten the elbow passively only (no active elbow motion). You may use your hand and wrist for typing, writing, and managing utensils (cutting food). Do not lift more than a coffee cup for 8 weeks.  When sleeping or resting, inclined positions (recliner chair or wedge pillow) and a pillow under the forearm for support may provide better comfort for up to 4 weeks.  Avoid long distance travel for 4 weeks.  Return to normal activities after rotator cuff repair repair normally takes 6 months on average. If rehab goes very well, may be able to do most activities at 4 months, except overhead or contact sports.  4. Physical Therapy: Begins 3-4 days after surgery, and proceed 1 time per week for the first 6 weeks, then 1-2 times per week from weeks 6-20 post-op.  5. Medications:  - You will be provided a prescription for narcotic pain medicine. After surgery, take 1-2 narcotic tablets every 4 hours if needed for severe pain.  - A prescription for anti-nausea medication will be provided in case the narcotic medicine causes nausea - take 1 tablet every 6 hours only if nauseated.   - Take tylenol 1000 mg (2 Extra Strength tablets or 3 regular strength) every 8 hours for pain.  May decrease or stop tylenol 5 days after surgery if you are having minimal pain. - Take ASA 325mg/day x 2 weeks to help prevent DVTs/PEs (blood clots).  - DO NOT take ANY nonsteroidal anti-inflammatory pain medications (Advil, Motrin, Ibuprofen, Aleve,  Naproxen, or Naprosyn). These medicines can inhibit healing of your shoulder repair.    If you are taking prescription medication for anxiety, depression, insomnia, muscle spasm, chronic pain, or for attention deficit disorder, you are advised that you are at a higher risk of adverse effects with use of narcotics post-op, including narcotic addiction/dependence, depressed breathing, death. If you use non-prescribed substances: alcohol, marijuana, cocaine, heroin, methamphetamines, etc., you are at a higher risk of adverse effects with use of narcotics post-op, including narcotic addiction/dependence, depressed breathing, death. You are advised that taking > 50 morphine milligram equivalents (MME) of narcotic pain medication per day results in twice the risk of overdose or death. For your prescription provided: oxycodone 5 mg - taking more than 6 tablets per day would result in > 50 morphine milligram equivalents (MME) of narcotic pain medication. Be advised that we will prescribe narcotics short-term, for acute post-operative pain only - 3 weeks for major operations such as shoulder repair/reconstruction surgeries.     6. Post-Op Appointment:  Your first post-op appointment will be 10-14 days post-op.  7. Work or School: For most, but not all procedures, we advise staying out of work or school for at least 1 to 2 weeks in order to recover from the stress of surgery and to allow time for healing.   If you need a work or school note this can be provided.   8. Smoking: If you are a smoker, you need to refrain from   smoking in the postoperative period. The nicotine in cigarettes will inhibit healing of your shoulder repair and decrease the chance of successful repair. Similarly, nicotine containing products (gum, patches) should be avoided.   Post-operative Brace: Apply and remove the brace you received as you were instructed to at the time of fitting and as described in detail as the braces  instructions for use indicate.  Wear the brace for the period of time prescribed by your physician.  The brace can be cleaned with soap and water and allowed to air dry only.  Should the brace result in increased pain, decreased feeling (numbness/tingling), increased swelling or an overall worsening of your medical condition, please contact your doctor immediately.  If an emergency situation occurs as a result of wearing the brace after normal business hours, please dial 911 and seek immediate medical attention.  Let your doctor know if you have any further questions about the brace issued to you. Refer to the shoulder sling instructions for use if you have any questions regarding the correct fit of your shoulder sling.  [facility]: [phone]  Video that illustrates how to properly use a shoulder sling: "Instructions for Proper Use of an Orthopaedic Sling" [email or web address]         Information for Discharge Teaching: EXPAREL (bupivacaine liposome injectable suspension)   Your surgeon or anesthesiologist gave you EXPAREL(bupivacaine) to help control your pain after surgery.   EXPAREL is a local anesthetic that provides pain relief by numbing the tissue around the surgical site.  EXPAREL is designed to release pain medication over time and can control pain for up to 72 hours.  Depending on how you respond to EXPAREL, you may require less pain medication during your recovery.  Possible side effects:  Temporary loss of sensation or ability to move in the area where bupivacaine was injected.  Nausea, vomiting, constipation  Rarely, numbness and tingling in your mouth or lips, lightheadedness, or anxiety may occur.  Call your doctor right away if you think you may be experiencing any of these sensations, or if you have other questions regarding possible side effects.  Follow all other discharge instructions given to you by your  surgeon or nurse. Eat a healthy diet and drink plenty of water or other fluids.  If you return to the hospital for any reason within 96 hours following the administration of EXPAREL, it is important for health care providers to know that you have received this anesthetic. A teal colored band has been placed on your arm with the date, time and amount of EXPAREL you have received in order to alert and inform your health care providers. Please leave this armband in place for the full 96 hours following administration, and then you may remove the band.  Scopolamine skin patches REMOVE PATCH IN 72 HOURS AND [place] HANDS IMMEDIATELY What is this medicine? SCOPOLAMINE (skoe POL a meen) is used to prevent nausea and vomiting caused by motion sickness, anesthesia and surgery. This medicine may be used for other purposes; ask your health care provider or pharmacist if you have questions. COMMON BRAND NAME(S): Transderm Scop What should I tell my health care provider before I take this medicine? They need to know if you have any of these conditions:  are scheduled to have a gastric secretion test  glaucoma  heart disease  kidney disease  liver disease  lung or breathing disease, like asthma  mental illness  prostate disease  seizures  stomach or intestine problems  trouble passing urine  an unusual or allergic reaction to scopolamine, atropine, other medicines, foods, dyes, or preservatives  pregnant or trying to get pregnant  breast-feeding How should I use this medicine? This medicine is for external use only. Follow the directions on the prescription label. Wear only 1 patch at a time. Choose an area behind the ear, that is clean, dry, hairless and free from any cuts or irritation. Wipe the area with a clean dry tissue. Peel off the plastic backing of the skin patch, trying not to touch the adhesive side with your hands. Do not cut the patches. Firmly apply to the area you have chosen,  with the metallic side of the patch to the skin and the tan-colored side showing. Once firmly in place, wash your hands well with soap and water. Do not get this medicine into your eyes. After removing the patch, wash your hands and the area behind your ear thoroughly with soap and water. The patch will still contain some medicine after use. To avoid accidental contact or ingestion by children or pets, fold the used patch in half with the sticky side together and throw away in the trash out of the reach of children and pets. If you need to use a second patch after you remove the first, place it behind the other ear. A special MedGuide will be given to you by the pharmacist with each prescription and refill. Be sure to read this information carefully each time. Talk to your pediatrician regarding the use of this medicine in children. Special care may be needed. Overdosage: If you think you have taken too much of this medicine contact a poison control center or emergency room at once. NOTE: This medicine is only for you. Do not share this medicine with others. What if I miss a dose? This does not apply. This medicine is not for regular use. What may interact with this medicine?  alcohol  antihistamines for allergy cough and cold  atropine  certain medicines for anxiety or sleep  certain medicines for bladder problems like oxybutynin, tolterodine  certain medicines for depression like amitriptyline, fluoxetine, sertraline  certain medicines for stomach problems like dicyclomine, hyoscyamine  certain medicines for Parkinson's disease like benztropine, trihexyphenidyl  certain medicines for seizures like phenobarbital, primidone  general anesthetics like halothane, isoflurane, methoxyflurane, propofol  ipratropium  local anesthetics like lidocaine, pramoxine, tetracaine  medicines that relax muscles for surgery  phenothiazines like chlorpromazine, mesoridazine, prochlorperazine,  thioridazine  narcotic medicines for pain  other belladonna alkaloids This list may not describe all possible interactions. Give your health care provider a list of all the medicines, herbs, non-prescription drugs, or dietary supplements you use. Also tell them if you smoke, drink alcohol, or use illegal drugs. Some items may interact with your medicine. What should I watch for while using this medicine? Limit contact with water while swimming and bathing because the patch may fall off. If the patch falls off, throw it away and put a new one behind the other ear. You may get drowsy or dizzy. Do not drive, use machinery, or do anything that needs mental alertness until you know how this medicine affects you. Do not stand or sit up quickly, especially if you are an older patient. This reduces the risk of dizzy or fainting spells. Alcohol may interfere with the effect of this medicine. Avoid alcoholic drinks. Your mouth may get dry. Chewing sugarless gum or sucking hard candy, and drinking plenty of water may help. Contact  your healthcare professional if the problem does not go away or is severe. This medicine may cause dry eyes and blurred vision. If you wear contact lenses, you may feel some discomfort. Lubricating drops may help. See your healthcare professional if the problem does not go away or is severe. If you are going to need surgery, an MRI, CT scan, or other procedure, tell your healthcare professional that you are using this medicine. You may need to remove the patch before the procedure. What side effects may I notice from receiving this medicine? Side effects that you should report to your doctor or health care professional as soon as possible:  allergic reactions like skin rash, itching or hives; swelling of the face, lips, or tongue  blurred vision  changes in vision  confusion  dizziness  eye pain  fast, irregular heartbeat  hallucinations, loss of contact with  reality  nausea, vomiting  pain or trouble passing urine  restlessness  seizures  skin irritation  stomach pain Side effects that usually do not require medical attention (report to your doctor or health care professional if they continue or are bothersome):  drowsiness  dry mouth  headache  sore throat This list may not describe all possible side effects. Call your doctor for medical advice about side effects. You may report side effects to FDA at 1-800-FDA-1088. Where should I keep my medicine? Keep out of the reach of children. Store at room temperature between 20 and 25 degrees C (68 and 77 degrees F). Keep this medicine in the foil package until ready to use. Throw away any unused medicine after the expiration date. NOTE: This sheet is a summary. It may not cover all possible information. If you have questions about this medicine, talk to your doctor, pharmacist, or health care provider.  2021 Elsevier/Gold Standard (2018-02-02 16:14:46)  General Anesthesia, Adult, Care After This sheet gives you information about how to care for yourself after your procedure. Your health care provider may also give you more specific instructions. If you have problems or questions, contact your health care provider. What can I expect after the procedure? After the procedure, the following side effects are common:  Pain or discomfort at the IV site.  Nausea.  Vomiting.  Sore throat.  Trouble concentrating.  Feeling cold or chills.  Weak or tired.  Sleepiness and fatigue.  Soreness and body aches. These side effects can affect parts of the body that were not involved in surgery. Follow these instructions at home:  For at least 24 hours after the procedure:  Have a responsible adult stay with you. It is important to have someone help care for you until you are awake and alert.  Rest as needed.  Do not: ? Participate in activities in which you could fall or become  injured. ? Drive. ? Use heavy machinery. ? Drink alcohol. ? Take sleeping pills or medicines that cause drowsiness. ? Make important decisions or sign legal documents. ? Take care of children on your own. Eating and drinking  Follow any instructions from your health care provider about eating or drinking restrictions.  When you feel hungry, start by eating small amounts of foods that are soft and easy to digest (bland), such as toast. Gradually return to your regular diet.  Drink enough fluid to keep your urine pale yellow.  If you vomit, rehydrate by drinking water, juice, or clear broth. General instructions  If you have sleep apnea, surgery and certain medicines can increase your risk  for breathing problems. Follow instructions from your health care provider about wearing your sleep device: ? Anytime you are sleeping, including during daytime naps. ? While taking prescription pain medicines, sleeping medicines, or medicines that make you drowsy.  Return to your normal activities as told by your health care provider. Ask your health care provider what activities are safe for you.  Take over-the-counter and prescription medicines only as told by your health care provider.  If you smoke, do not smoke without supervision.  Keep all follow-up visits as told by your health care provider. This is important. Contact a health care provider if:  You have nausea or vomiting that does not get better with medicine.  You cannot eat or drink without vomiting.  You have pain that does not get better with medicine.  You are unable to pass urine.  You develop a skin rash.  You have a fever.  You have redness around your IV site that gets worse. Get help right away if:  You have difficulty breathing.  You have chest pain.  You have blood in your urine or stool, or you vomit blood. Summary  After the procedure, it is common to have a sore throat or nausea. It is also common to  feel tired.  Have a responsible adult stay with you for the first 24 hours after general anesthesia. It is important to have someone help care for you until you are awake and alert.  When you feel hungry, start by eating small amounts of foods that are soft and easy to digest (bland), such as toast. Gradually return to your regular diet.  Drink enough fluid to keep your urine pale yellow.  Return to your normal activities as told by your health care provider. Ask your health care provider what activities are safe for you. This information is not intended to replace advice given to you by your health care provider. Make sure you discuss any questions you have with your health care provider. Document Revised: 11/17/2017 Document Reviewed: 06/30/2017 Elsevier Patient Education  2020 [employer].

## 2020-12-07 NOTE — Transfer of Care (Signed)
Immediate Anesthesia Transfer of Care Note  Patient: Teresa Rodriguez  Procedure(s) Performed: Left shoulder arthroscopic vs mini-open rotator cuff repair, biceps tenodesis, and subacromial decompression (Left )  Patient Location: PACU  Anesthesia Type: General LMA  Level of Consciousness: awake, alert  and patient cooperative  Airway and Oxygen Therapy: Patient Spontanous Breathing and Patient connected to supplemental oxygen  Post-op Assessment: Post-op Vital signs reviewed, Patient's Cardiovascular Status Stable, Respiratory Function Stable, Patent Airway and No signs of Nausea or vomiting  Post-op Vital Signs: Reviewed and stable  Complications: No complications documented.

## 2020-12-07 NOTE — Anesthesia Procedure Notes (Signed)
Procedure Name: LMA Insertion Date/Time: 12/07/2020 11:35 AM Performed by: Cameron Ali, CRNA Pre-anesthesia Checklist: Patient identified, Emergency Drugs available, Suction available, Timeout performed and Patient being monitored Patient Re-evaluated:Patient Re-evaluated prior to induction Oxygen Delivery Method: Circle system utilized Preoxygenation: Pre-oxygenation with 100% oxygen Induction Type: IV induction LMA: LMA inserted LMA Size: 4.0 Number of attempts: 1 Placement Confirmation: positive ETCO2 and breath sounds checked- equal and bilateral Tube secured with: Tape Dental Injury: Teeth and Oropharynx as per pre-operative assessment

## 2020-12-07 NOTE — H&P (Signed)
Paper H&P to be scanned into permanent record. H&P reviewed. No significant changes noted.  

## 2020-12-07 NOTE — Progress Notes (Signed)
Assisted Mike Stella ANMD with left, ultrasound guided, interscalene block. Side rails up, monitors on throughout procedure. See vital signs in flow sheet. Tolerated Procedure well.   

## 2020-12-07 NOTE — Anesthesia Preprocedure Evaluation (Signed)
Anesthesia Evaluation  Patient identified by MRN, date of birth, ID band Patient awake    Reviewed: Allergy & Precautions, H&P , NPO status , Patient's Chart, lab work & pertinent test results  Airway Mallampati: II  TM Distance: >3 FB Neck ROM: full    Dental no notable dental hx.    Pulmonary    Pulmonary exam normal breath sounds clear to auscultation       Cardiovascular Normal cardiovascular exam Rhythm:regular Rate:Normal     Neuro/Psych    GI/Hepatic GERD  ,  Endo/Other    Renal/GU      Musculoskeletal   Abdominal   Peds  Hematology   Anesthesia Other Findings   Reproductive/Obstetrics                             Anesthesia Physical Anesthesia Plan  ASA: II  Anesthesia Plan: General LMA   Post-op Pain Management:  [facility] for Post-op pain   Induction:   PONV Risk Score and Plan: 3 and Treatment may vary due to age or medical condition, Ondansetron, Dexamethasone and Scopolamine patch - Pre-op  Airway Management Planned:   Additional Equipment:   Intra-op Plan:   Post-operative Plan:   Informed Consent: I have reviewed the patients History and Physical, chart, labs and discussed the procedure including the risks, benefits and alternatives for the proposed anesthesia with the patient or authorized representative who has indicated his/her understanding and acceptance.     Dental Advisory Given  Plan Discussed with: CRNA  Anesthesia Plan Comments:         Anesthesia Quick Evaluation

## 2020-12-08 ENCOUNTER — Encounter: Payer: Self-pay | Admitting: Orthopedic Surgery

## 2020-12-09 ENCOUNTER — Ambulatory Visit: Payer: Managed Care, Other (non HMO)

## 2021-02-02 ENCOUNTER — Other Ambulatory Visit: Payer: Self-pay | Admitting: Orthopedic Surgery

## 2021-02-02 NOTE — Progress Notes (Signed)
error 

## 2021-02-15 ENCOUNTER — Encounter: Payer: Self-pay | Admitting: Occupational Therapy

## 2021-02-15 ENCOUNTER — Other Ambulatory Visit: Payer: Self-pay

## 2021-02-15 ENCOUNTER — Ambulatory Visit: Payer: Managed Care, Other (non HMO) | Attending: Orthopedic Surgery | Admitting: Occupational Therapy

## 2021-02-15 DIAGNOSIS — M6281 Muscle weakness (generalized): Secondary | ICD-10-CM

## 2021-02-15 DIAGNOSIS — R600 Localized edema: Secondary | ICD-10-CM | POA: Diagnosis not present

## 2021-02-15 DIAGNOSIS — M25632 Stiffness of left wrist, not elsewhere classified: Secondary | ICD-10-CM

## 2021-02-15 DIAGNOSIS — M25642 Stiffness of left hand, not elsewhere classified: Secondary | ICD-10-CM

## 2021-02-15 NOTE — Therapy (Signed)
[facility] [address], [place], [address] Phone: [phone]   Fax:  [phone]  Occupational Therapy Evaluation  Patient Details  Name: Teresa Rodriguez MRN: [id number] Date of Birth: 09-Sep-1971 Referring Provider (OT): Dr Posey Pronto   Encounter Date: 02/15/2021   OT End of Session - 02/15/21 1543    Visit Number 1    Number of Visits 8    Date for OT Re-Evaluation 03/29/21    OT Start Time 1415    OT Stop Time 1522    OT Time Calculation (min) 67 min    Activity Tolerance Patient tolerated treatment well    Behavior During Therapy [facility] for tasks assessed/performed           Past Medical History:  Diagnosis Date  . COVID-19 06/2020  . GERD (gastroesophageal reflux disease)   . Headache   . Hypertriglyceridemia   . Seasonal allergies     Past Surgical History:  Procedure Laterality Date  . OVARIAN CYST REMOVAL Right   . SHOULDER ARTHROSCOPY WITH ROTATOR CUFF REPAIR AND SUBACROMIAL DECOMPRESSION Left 12/07/2020   Procedure: Left shoulder mini-open rotator cuff repair, arthroscopic biceps tenodesis, and subacromial decompression;  Surgeon: Leim Fabry, MD;  Location: [place];  Service: Orthopedics;  Laterality: Left;  Reche Dixon to Assist    There were no vitals filed for this visit.   Subjective Assessment - 02/15/21 1531    Subjective  I was first in sling for about 6 wks , did not notice my arm swelling - but after that - and I cannot support or hold my arm really up to use it- my arm just feel heavy, tight , stiff and my thumb is sore    Pertinent History DR Posey Pronto did left shoulder arthroscopy 12/07/20     12/07/20:  PROCEDURES:   1. Left mini open rotator cuff repair  2. Left arthroscopic biceps tenodesis  3. Left subacromial decompression  4. Left extensive debridement of shoulder (glenohumeral and subacromial spaces)    History of Present Illness:  02/02/2021:  She was on 02/01/21  approximately 8 weeks after undergoing above procedures. She returns to the office earlier than anticipated due to persistent left hand and forearm swelling and difficulty using her fingers. She also notes occasional color changes if her hand is in a dependent position for prolonged period of time. She also notes that the left hand feels warmer than the right hand frequently. She does not have exquisite pain in her hands although there are certain areas that are more tender than what she would expect - refer to hand therapy - ? CRPS    Patient Stated Goals Want to get the swelling and heavy feeling better so I can use my hand    Currently in Pain? Yes    Pain Score 4     Pain Location Shoulder    Pain Orientation Left    Pain Descriptors / Indicators Aching    Pain Type Surgical pain    Pain Onset More than a month ago    Pain Frequency Intermittent             [facility] OT Assessment - 02/15/21 0001      Assessment   Medical Diagnosis Edema L UE/CRPS    Referring Provider (OT) Dr Posey Pronto    Onset Date/Surgical Date 12/07/20    Hand Dominance Right    Prior Therapy --   PT for L shoulder at  [facility]     Precautions   Precautions --   RTC repair precautions     Home  Environment   Lives With Family   mom, daughter and grand daughter 17 months     Prior Function   Vocation Full time employment    Leisure Civil engineer, contracting, likes to reach, Haematologist, house work ,has Higher education careers adviser at [employer]      AROM   Left Forearm Pronation 90 Degrees    Left Forearm Supination 70 Degrees    Left Wrist Extension 35 Degrees    Left Wrist Flexion 50 Degrees    Left Wrist Radial Deviation 15 Degrees    Left Wrist Ulnar Deviation 25 Degrees      Left Hand AROM   L Thumb Opposition to Index --   opposition- pain in thumb MC to 4th digit   L Index  MCP 0-90 60 Degrees    L Index PIP 0-100 90 Degrees    L Long  MCP 0-90 60 Degrees    L Long PIP 0-100 90 Degrees    L Ring  MCP 0-90 60 Degrees     L Ring PIP 0-100 90 Degrees    L Little  MCP 0-90 80 Degrees    L Little PIP 0-100 95 Degrees            LYMPHEDEMA/ONCOLOGY QUESTIONNAIRE - 02/15/21 0001      Right Upper Extremity Lymphedema   15 cm Proximal to Olecranon Process 29 cm    10 cm Proximal to Olecranon Process 26.6 cm    Olecranon Process 25.5 cm    15 cm Proximal to Ulnar Styloid Process 25 cm    10 cm Proximal to Ulnar Styloid Process 21.5 cm    Just Proximal to Ulnar Styloid Process 16.8 cm    Across Hand at [employer] 19 cm    At [place] of 2nd Digit 6.4 cm    At [facility] of Thumb 6 cm      Left Upper Extremity Lymphedema   15 cm Proximal to Olecranon Process 29 cm    10 cm Proximal to Olecranon Process 27 cm    Olecranon Process 26.4 cm    15 cm Proximal to Ulnar Styloid Process 26.3 cm    10 cm Proximal to Ulnar Styloid Process 23.4 cm    Just Proximal to Ulnar Styloid Process 19 cm    Across Hand at [employer] 19.4 cm    At [place] of 2nd Digit 7 cm    At [facility] of Thumb 6.4 cm             Manual Lymph Drainage  Do manual lymph drainage once each day to help decrease swelling.  This should take you about 15 minutes depending on the size of your limb.  For Left Arm:  1. In supine- OT held L arm at 90 degrees shoulder flexion -family to hold and help with her MLD 3. Pump across chest from left to right 8 times 4. Pump down the left side of trunk from armpit to groin 8 times 7. 5. Pump up the outside of left upper arm 8 times  8. . Pump top of forearm from wrist to elbow 8 times 9. Pump up the outside of left upper arm 8 times 10.       Pump across chest from left to right 8 times 11. Pump down the left side of trunk from armpit to groin 8 times  12. Pump up the back of the forearm from wrist to elbow 8 times 13. Pump up the outside of left upper arm 8 times 14. Pump across chest from left to right 8 times 15. Pump down the left side of trunk from armpit to groin 4-5 times \ LOW and LIGHT  with only your palm NOT FINGERTIPS   AROM for tendon glides -and opposition to all digits Pain less than 2/10 and only AROM no putty or squeezing ball  Fitted with medium isotoner glove and tubigrip D from hand to elbow -and then for daytime can add tubigrip E from forearm to axilla- making sure no tourniquet on upper arm              OT Education - 02/15/21 1542    Education Details Findings of eval and HEP    Person(s) Educated Patient    Methods Explanation;Demonstration;Tactile cues;Verbal cues;Handout    Comprehension Verbal cues required;Returned demonstration;Verbalized understanding            OT Short Term Goals - 02/15/21 1552      OT SHORT TERM GOAL #1   Title Pt to be independent in MLD and wearing of compression to decrease L UE circumference by 1 cm in wrist and forearm and 0.5 cm in upperarm and hand to show increase digits and wrist AROM    Baseline Circumference increase in L  hand and digits by 0.6 cm, wrist and forearm 2.2cm, elbow 0.9 cm and upper arm 0.4 cm - AROM in digits and wrist decrease see flowsheet    Time 3    Period Weeks    Status New    Target Date 03/08/21             OT Long Term Goals - 02/15/21 1555      OT LONG TERM GOAL #1   Title Pt edema in L UE decrease to less than 0.5 from digits to upper arm to be able to use hand in gripping ADL's objects, utencils and increase ease  on computer    Baseline increase from digits to upperarm - compare to R UE - see STG - not able to hold or girp objects - one finger at time on computer    Time 4    Period Weeks    Status New    Target Date 03/15/21      OT LONG TERM GOAL #2   Title L digits and wrist AROM increase to WNL to grip toys to  play with granddaugther ,  grip objects in kitchen at waist height    Baseline unable to use L hand to play or use at home- stiffness in wrist and digits - Wrist ext 35, flexion 50 , UD 25, RD 15 - MC 60-80's and PIP's 90-95's    Time 6    Period  Weeks    Status New    Target Date 03/29/21      OT LONG TERM GOAL #3   Title Initiate grip strength and prehension in L hand    Baseline Only edema control and AROM at this time in L hand -    Time 4    Period Weeks    Status New    Target Date 03/15/21      OT LONG TERM GOAL #4   Title L elbow AROM increase to WNL to use on computer and use with bathing and dressing    Baseline feel pull  -cannot keep all  the way extended the L elbow, supination decrease - cannot use on computer - only one finger at time- cannot use for bathing and dressing little    Time 5    Period Weeks    Status New    Target Date 03/22/21                 Plan - 02/15/21 1545    Clinical Impression Statement Pt present at OT eval 10 wks s/p from L shoulder arthroscopy - pt with increase edema in L UE - with digits and hand increase 0.6 cm, wrist 2.2cm, forearm 1.9 cm , elbow 1.3 cm and upper arm 0.9 cm compare to R  -  limiting her L elbow, wrist and digits AROM - in all planes- pain mostly in L thumb 2/10 with AROM - and 4/10 in shoulder- pt limited with use of L UE in ADL's and ADL's- pt denies pain or tenderness -mostly - full , heavy and tight/stiff feeling in L arm - pt ed on MLD , fitted with compression isotoner glove and tubi grip D and E for daytime -and to take off E at night time- family to assist her with MLD -and AROM for digits and wrist - pain free pt can benefit from OT services    OT Occupational Profile and History Problem Focused Assessment - Including review of records relating to presenting problem    Occupational performance deficits (Please refer to evaluation for details): ADL's;IADL's;Rest and Sleep;Work;Play;Leisure;Social Participation    Body Structure / Function / Physical Skills ADL;Coordination;IADL;Pain;Strength;Edema;Dexterity;FMC;Flexibility;ROM;UE functional use    Rehab Potential Good    Clinical Decision Making Several treatment options, min-mod task modification necessary     Comorbidities Affecting Occupational Performance: None    Modification or Assistance to Complete Evaluation  Min-Moderate modification of tasks or assist with assess necessary to complete eval    OT Frequency 2x / week   2 x wk for 2 wks , 1 x wk   OT Duration 6 weeks    OT Treatment/Interventions Self-care/ADL training;Contrast Bath;Therapeutic exercise;Manual lymph drainage;Patient/family education;Splinting;Compression bandaging;Manual Therapy;Passive range of motion    Consulted and Agree with Plan of Care Patient           Patient will benefit from skilled therapeutic intervention in order to improve the following deficits and impairments:   Body Structure / Function / Physical Skills: ADL,Coordination,IADL,Pain,Strength,Edema,Dexterity,FMC,Flexibility,ROM,UE functional use       Visit Diagnosis: Localized edema - Plan: Ot plan of care cert/re-cert  Stiffness of left wrist, not elsewhere classified - Plan: Ot plan of care cert/re-cert  Stiffness of left hand, not elsewhere classified - Plan: Ot plan of care cert/re-cert  Muscle weakness (generalized) - Plan: Ot plan of care cert/re-cert    Problem List There are no problems to display for this patient.   Rosalyn Gess  OTR/L,CLT 02/15/2021, 5:23 PM  [facility] [address] [place], [address] Phone: [phone]   Fax:  [phone]  Name: Teresa Rodriguez MRN: [id number] Date of Birth: 06/21/1971

## 2021-02-22 ENCOUNTER — Other Ambulatory Visit: Payer: Self-pay

## 2021-02-22 ENCOUNTER — Ambulatory Visit: Payer: Managed Care, Other (non HMO) | Admitting: Occupational Therapy

## 2021-02-22 DIAGNOSIS — M25642 Stiffness of left hand, not elsewhere classified: Secondary | ICD-10-CM

## 2021-02-22 DIAGNOSIS — M25632 Stiffness of left wrist, not elsewhere classified: Secondary | ICD-10-CM

## 2021-02-22 DIAGNOSIS — M6281 Muscle weakness (generalized): Secondary | ICD-10-CM

## 2021-02-22 DIAGNOSIS — R600 Localized edema: Secondary | ICD-10-CM

## 2021-02-22 NOTE — Therapy (Signed)
[facility] [address], [place], [address] Phone: [phone]   Fax:  [phone]  Occupational Therapy Treatment  Patient Details  Name: Teresa Rodriguez MRN: [id number] Date of Birth: 01/07/1971 Referring Provider (OT): Dr Posey Pronto   Encounter Date: 02/22/2021   OT End of Session - 02/22/21 2003    Visit Number 2    Number of Visits 8    Date for OT Re-Evaluation 03/29/21    OT Start Time 1430    OT Stop Time 1514    OT Time Calculation (min) 44 min    Activity Tolerance Patient tolerated treatment well    Behavior During Therapy [facility] for tasks assessed/performed           Past Medical History:  Diagnosis Date  . COVID-19 06/2020  . GERD (gastroesophageal reflux disease)   . Headache   . Hypertriglyceridemia   . Seasonal allergies     Past Surgical History:  Procedure Laterality Date  . OVARIAN CYST REMOVAL Right   . SHOULDER ARTHROSCOPY WITH ROTATOR CUFF REPAIR AND SUBACROMIAL DECOMPRESSION Left 12/07/2020   Procedure: Left shoulder mini-open rotator cuff repair, arthroscopic biceps tenodesis, and subacromial decompression;  Surgeon: Leim Fabry, MD;  Location: [facility];  Service: Orthopedics;  Laterality: Left;  Reche Dixon to Assist    There were no vitals filed for this visit.   Subjective Assessment - 02/22/21 2001    Subjective  Better- I can tell the swelling in my hand and forearm better- and more motion but my fingers are stiff- still very hard to use my hand because of my shoulder    Pertinent History DR Posey Pronto did left shoulder arthroscopy 12/07/20     12/07/20:  PROCEDURES:   1. Left mini open rotator cuff repair  2. Left arthroscopic biceps tenodesis  3. Left subacromial decompression  4. Left extensive debridement of shoulder (glenohumeral and subacromial spaces)    History of Present Illness:  02/02/2021:  She was on 02/01/21 approximately 8 weeks after undergoing above  procedures. She returns to the office earlier than anticipated due to persistent left hand and forearm swelling and difficulty using her fingers. She also notes occasional color changes if her hand is in a dependent position for prolonged period of time. She also notes that the left hand feels warmer than the right hand frequently. She does not have exquisite pain in her hands although there are certain areas that are more tender than what she would expect - refer to hand therapy - ? CRPS    Patient Stated Goals Want to get the swelling and heavy feeling better so I can use my hand    Currently in Pain? Yes    Pain Score 4     Pain Location Shoulder    Pain Orientation Left    Pain Descriptors / Indicators Aching    Pain Type Surgical pain    Pain Onset More than a month ago    Pain Frequency Intermittent              [facility] OT Assessment - 02/22/21 0001      AROM   Left Forearm Pronation 90 Degrees    Left Forearm Supination 90 Degrees    Left Wrist Extension 50 Degrees    Left Wrist Flexion 75 Degrees    Left Wrist Radial Deviation 25 Degrees    Left Wrist Ulnar Deviation 20 Degrees  LYMPHEDEMA/ONCOLOGY QUESTIONNAIRE - 02/22/21 0001      Left Upper Extremity Lymphedema   10 cm Proximal to Olecranon Process 27 cm    Olecranon Process 26 cm    15 cm Proximal to Ulnar Styloid Process 25.8 cm    10 cm Proximal to Ulnar Styloid Process 22.2 cm    Just Proximal to Ulnar Styloid Process 17.4 cm    Across Hand at [employer] 18.5 cm    At [place] of 2nd Digit 6.8 cm    At [facility] of Thumb 6.2 cm           Measurements taken of L UE - great progress an decongestion of L UE - distal more than proximal  Increase supination , elbow extention , wrist flexion , ext  Cont to have some pain and stiffness in wrist and MC , composite flexion -and thumb AROM           OT Treatments/Exercises (OP) - 02/22/21 0001      LUE Contrast Bath   Time 8 minutes    Comments L arm  to side elevated on pillow - elbow to distally          After contrast - done some AAROM and AROM for digits , wrist , forearm and elbow     Review with pt again Manual Lymph Drainage  Do manual lymph drainage once each day to help decrease swelling.  This should take you about 15 minutes depending on the size of your limb.  For Left Arm:  1.         In supine- OT held L arm at 90 degrees shoulder flexion -family to hold and help with her MLD 3. Pump across chest from left to right 8 times 4. Pump down the left side of trunk from armpit to groin 8 times 7. 5.         Pump up the outside of left upper arm 8 times  8. .           Pump top of forearm from wrist to elbow 8 times 9.         Pump up the outside of left upper arm 8 times 10.       Pump across chest from left to right 8 times 11. Pump down the left side of trunk from armpit to groin 8 times 12.       Pump up the back of the forearm from wrist to elbow 8 times 13.       Pump up the outside of left upper arm 8 times 14. Pump across chest from left to right 8 times 15. Pump down the left side of trunk from armpit to groin 4-5 times \ LOW and LIGHT with only your palm NOT FINGERTIPS    Pt to cont with medium isotoner glove and fitted with new tubigrip D from hand to elbow -and then for daytime can add tubigrip E from forearm to axilla- making sure no tourniquet on upper arm- new Tubigrip E provided too         OT Education - 02/22/21 2003    Education Details progress and changes to HEP    Person(s) Educated Patient    Methods Explanation;Demonstration;Tactile cues;Verbal cues;Handout    Comprehension Verbal cues required;Returned demonstration;Verbalized understanding            OT Short Term Goals - 02/15/21 1552      OT SHORT TERM GOAL #1  Title Pt to be independent in MLD and wearing of compression to decrease L UE circumference by 1 cm in wrist and forearm and 0.5 cm in upperarm and hand to show  increase digits and wrist AROM    Baseline Circumference increase in L  hand and digits by 0.6 cm, wrist and forearm 2.2cm, elbow 0.9 cm and upper arm 0.4 cm - AROM in digits and wrist decrease see flowsheet    Time 3    Period Weeks    Status New    Target Date 03/08/21             OT Long Term Goals - 02/15/21 1555      OT LONG TERM GOAL #1   Title Pt edema in L UE decrease to less than 0.5 from digits to upper arm to be able to use hand in gripping ADL's objects, utencils and increase ease  on computer    Baseline increase from digits to upperarm - compare to R UE - see STG - not able to hold or girp objects - one finger at time on computer    Time 4    Period Weeks    Status New    Target Date 03/15/21      OT LONG TERM GOAL #2   Title L digits and wrist AROM increase to WNL to grip toys to  play with granddaugther ,  grip objects in kitchen at waist height    Baseline unable to use L hand to play or use at home- stiffness in wrist and digits - Wrist ext 35, flexion 50 , UD 25, RD 15 - MC 60-80's and PIP's 90-95's    Time 6    Period Weeks    Status New    Target Date 03/29/21      OT LONG TERM GOAL #3   Title Initiate grip strength and prehension in L hand    Baseline Only edema control and AROM at this time in L hand -    Time 4    Period Weeks    Status New    Target Date 03/15/21      OT LONG TERM GOAL #4   Title L elbow AROM increase to WNL to use on computer and use with bathing and dressing    Baseline feel pull  -cannot keep all the way extended the L elbow, supination decrease - cannot use on computer - only one finger at time- cannot use for bathing and dressing little    Time 5    Period Weeks    Status New    Target Date 03/22/21                 Plan - 02/22/21 2004    Clinical Impression Statement Pt present at OT eval 11 wks s/p from L shoulder arthroscopy - pt with decrease in L UE edema compare  to week ago - in and and wrist to forearm the  most  -and then some in elbow and upper arm-  SUpination improve to WNL ,and elbow extention to WNL with less of pull - Review again with pt to cont compression , MLD and AROM for elbow , wrist , forearm and digitis in all planes - still some pain with wrist flexion, extention , thumb PA and RA - and composite fist - - family to assist her with MLD and donning compression as needed-pt can benefit from cont OT services    OT Occupational  Profile and History Problem Focused Assessment - Including review of records relating to presenting problem    Occupational performance deficits (Please refer to evaluation for details): ADL's;IADL's;Rest and Sleep;Work;Play;Leisure;Social Participation    Body Structure / Function / Physical Skills ADL;Coordination;IADL;Pain;Strength;Edema;Dexterity;FMC;Flexibility;ROM;UE functional use    Rehab Potential Good    Clinical Decision Making Several treatment options, min-mod task modification necessary    Comorbidities Affecting Occupational Performance: None    Modification or Assistance to Complete Evaluation  Min-Moderate modification of tasks or assist with assess necessary to complete eval    OT Frequency 2x / week    OT Duration 6 weeks    OT Treatment/Interventions Self-care/ADL training;Contrast Bath;Therapeutic exercise;Manual lymph drainage;Patient/family education;Splinting;Compression bandaging;Manual Therapy;Passive range of motion    Consulted and Agree with Plan of Care Patient           Patient will benefit from skilled therapeutic intervention in order to improve the following deficits and impairments:   Body Structure / Function / Physical Skills: ADL,Coordination,IADL,Pain,Strength,Edema,Dexterity,FMC,Flexibility,ROM,UE functional use       Visit Diagnosis: Stiffness of left wrist, not elsewhere classified  Stiffness of left hand, not elsewhere classified  Muscle weakness (generalized)  Localized edema    Problem List There are no  problems to display for this patient.   Rosalyn Gess OTR/L,CLT 02/22/2021, 8:09 PM  [facility] [address], [place], [address] Phone: [phone]   Fax:  [phone]  Name: Teresa Rodriguez MRN: [id number] Date of Birth: Jul 26, 1971

## 2021-02-25 ENCOUNTER — Ambulatory Visit: Payer: Managed Care, Other (non HMO) | Admitting: Occupational Therapy

## 2021-02-25 ENCOUNTER — Other Ambulatory Visit: Payer: Self-pay

## 2021-02-25 DIAGNOSIS — M6281 Muscle weakness (generalized): Secondary | ICD-10-CM

## 2021-02-25 DIAGNOSIS — M25632 Stiffness of left wrist, not elsewhere classified: Secondary | ICD-10-CM

## 2021-02-25 DIAGNOSIS — R600 Localized edema: Secondary | ICD-10-CM

## 2021-02-25 DIAGNOSIS — M25642 Stiffness of left hand, not elsewhere classified: Secondary | ICD-10-CM

## 2021-02-25 NOTE — Therapy (Signed)
[facility] [address] [place], [address] Phone: [phone]   Fax:  [phone]  Occupational Therapy Treatment  Patient Details  Name: Teresa Rodriguez MRN: [id number] Date of Birth: 06/17/1971 Referring Provider (OT): Dr Posey Pronto   Encounter Date: 02/25/2021   OT End of Session - 02/25/21 1705    Visit Number 3    Number of Visits 8    Date for OT Re-Evaluation 03/29/21    OT Start Time 1446    OT Stop Time 1530    OT Time Calculation (min) 44 min    Activity Tolerance Patient tolerated treatment well    Behavior During Therapy [facility] for tasks assessed/performed           Past Medical History:  Diagnosis Date  . COVID-19 06/2020  . GERD (gastroesophageal reflux disease)   . Headache   . Hypertriglyceridemia   . Seasonal allergies     Past Surgical History:  Procedure Laterality Date  . OVARIAN CYST REMOVAL Right   . SHOULDER ARTHROSCOPY WITH ROTATOR CUFF REPAIR AND SUBACROMIAL DECOMPRESSION Left 12/07/2020   Procedure: Left shoulder mini-open rotator cuff repair, arthroscopic biceps tenodesis, and subacromial decompression;  Surgeon: Leim Fabry, MD;  Location: [facility];  Service: Orthopedics;  Laterality: Left;  Reche Dixon to Assist    There were no vitals filed for this visit.   Subjective Assessment - 02/25/21 1704    Subjective  Feels better- I can see my knuckles and wrist bone- and thumb feels better as well as my wrist- fingers still feels stiff    Pertinent History DR Posey Pronto did left shoulder arthroscopy 12/07/20     12/07/20:  PROCEDURES:   1. Left mini open rotator cuff repair  2. Left arthroscopic biceps tenodesis  3. Left subacromial decompression  4. Left extensive debridement of shoulder (glenohumeral and subacromial spaces)    History of Present Illness:  02/02/2021:  She was on 02/01/21 approximately 8 weeks after undergoing above procedures. She returns to the office earlier  than anticipated due to persistent left hand and forearm swelling and difficulty using her fingers. She also notes occasional color changes if her hand is in a dependent position for prolonged period of time. She also notes that the left hand feels warmer than the right hand frequently. She does not have exquisite pain in her hands although there are certain areas that are more tender than what she would expect - refer to hand therapy - ? CRPS    Patient Stated Goals Want to get the swelling and heavy feeling better so I can use my hand    Currently in Pain? Yes    Pain Score 3     Pain Location Shoulder    Pain Orientation Left    Pain Descriptors / Indicators Sore    Pain Type Surgical pain    Pain Onset More than a month ago    Pain Frequency Intermittent               LYMPHEDEMA/ONCOLOGY QUESTIONNAIRE - 02/25/21 0001      Left Upper Extremity Lymphedema   15 cm Proximal to Olecranon Process 29 cm    10 cm Proximal to Olecranon Process 27 cm    Olecranon Process 26 cm    15 cm Proximal to Ulnar Styloid Process 25 cm    10 cm Proximal to Ulnar Styloid Process 22 cm    Just Proximal to Ulnar Styloid Process  17.2 cm    Across Hand at [employer] 18 cm    At [place] of 2nd Digit 6.4 cm    At [facility] of Thumb 6 cm               Measurements taken of L UE - great  progress in decongestion of L UE in 2 sessions  Feel very good - but upper arm feels tight, full and fibrotic and brawny - scars still tender and adhesions  Increase supination , elbow extention , wrist flexion , ext  Cont to have some pain and stiffness in wrist and composite flexion -and thumb AROM - but improving         OT Treatments/Exercises (OP) - 02/25/21 0001      LUE Contrast Bath   Time 8 minutes    Comments L shoulder and upper arm prior to MLD            Pt in side lying and L UE supported - done contrast on upper arm and shoulder- decrease pain    Review with pt again Manual Lymph  Drainage  Do manual lymph drainage once each day to help decrease swelling. This should take you about 10minutes depending on the size of your limb. 1. Pump  Shoulder to upper traps 15 reps  2Pump up the outside of left upper arm 8 times 3 PUmp inside to outside of upper arm and outside of upper arm to shoulder 8 reps each   Rework several times  Done mini massager over lateral upper arm - gentle -and scars   tolerated well   Upper arm not as fibrotic and brawny   Pt to do daily until next time   LOW and LIGHT with only your palm NOT FINGERTIPS done the MLD     Pt to cont with medium isotoner glove and fitted with new tubigrip D from hand to elbow -and then for daytime can add tubigrip E from forearm to axilla- making sure no tourniquet on upper arm- new Tubigrip E provided too   AND supination to side - keep out to side  10 reps  AAROM for wrist flexion , extention over armrest and RD, UD AROM over armrest  10reps  Cont tendon glides and thumb PA and RA- opposition  10 reps           OT Education - 02/25/21 1705    Education Details progress and changes to HEP    Person(s) Educated Patient    Methods Explanation;Demonstration;Tactile cues;Verbal cues;Handout    Comprehension Verbal cues required;Returned demonstration;Verbalized understanding            OT Short Term Goals - 02/15/21 1552      OT SHORT TERM GOAL #1   Title Pt to be independent in MLD and wearing of compression to decrease L UE circumference by 1 cm in wrist and forearm and 0.5 cm in upperarm and hand to show increase digits and wrist AROM    Baseline Circumference increase in L  hand and digits by 0.6 cm, wrist and forearm 2.2cm, elbow 0.9 cm and upper arm 0.4 cm - AROM in digits and wrist decrease see flowsheet    Time 3    Period Weeks    Status New    Target Date 03/08/21             OT Long Term Goals - 02/15/21 1555      OT LONG TERM GOAL #1  Title Pt edema in L UE  decrease to less than 0.5 from digits to upper arm to be able to use hand in gripping ADL's objects, utencils and increase ease  on computer    Baseline increase from digits to upperarm - compare to R UE - see STG - not able to hold or girp objects - one finger at time on computer    Time 4    Period Weeks    Status New    Target Date 03/15/21      OT LONG TERM GOAL #2   Title L digits and wrist AROM increase to WNL to grip toys to  play with granddaugther ,  grip objects in kitchen at waist height    Baseline unable to use L hand to play or use at home- stiffness in wrist and digits - Wrist ext 35, flexion 50 , UD 25, RD 15 - MC 60-80's and PIP's 90-95's    Time 6    Period Weeks    Status New    Target Date 03/29/21      OT LONG TERM GOAL #3   Title Initiate grip strength and prehension in L hand    Baseline Only edema control and AROM at this time in L hand -    Time 4    Period Weeks    Status New    Target Date 03/15/21      OT LONG TERM GOAL #4   Title L elbow AROM increase to WNL to use on computer and use with bathing and dressing    Baseline feel pull  -cannot keep all the way extended the L elbow, supination decrease - cannot use on computer - only one finger at time- cannot use for bathing and dressing little    Time 5    Period Weeks    Status New    Target Date 03/22/21                 Plan - 02/25/21 1706    Clinical Impression Statement Pt is about 11 1/2 wks s/p R L shoulder arthroscopy - pt made great progress in decreasing of circumference in L UE - compare to eval - cont to wear isotoner glove, tubigrip D hand to elbow and E mid forearm to upper arm - pt to wear now E at night time too - pt with some brawny , fibrosis in upper arm , but reponded great this date after OT done contrast and MLD and soft tissue to upper arm and L upper quadrant - add sup/pro at side to HEP , AAROM flexion , ext of wrist over armrest , and UD , RD - making good progress    OT  Occupational Profile and History Problem Focused Assessment - Including review of records relating to presenting problem    Occupational performance deficits (Please refer to evaluation for details): ADL's;IADL's;Rest and Sleep;Work;Play;Leisure;Social Participation    Body Structure / Function / Physical Skills ADL;Coordination;IADL;Pain;Strength;Edema;Dexterity;FMC;Flexibility;ROM;UE functional use    Rehab Potential Good    Clinical Decision Making Several treatment options, min-mod task modification necessary    Comorbidities Affecting Occupational Performance: None    Modification or Assistance to Complete Evaluation  Min-Moderate modification of tasks or assist with assess necessary to complete eval    OT Frequency 2x / week    OT Duration 6 weeks    OT Treatment/Interventions Self-care/ADL training;Contrast Bath;Therapeutic exercise;Manual lymph drainage;Patient/family education;Splinting;Compression bandaging;Manual Therapy;Passive range of motion    Consulted  and Agree with Plan of Care Patient           Patient will benefit from skilled therapeutic intervention in order to improve the following deficits and impairments:   Body Structure / Function / Physical Skills: ADL,Coordination,IADL,Pain,Strength,Edema,Dexterity,FMC,Flexibility,ROM,UE functional use       Visit Diagnosis: Stiffness of left wrist, not elsewhere classified  Stiffness of left hand, not elsewhere classified  Muscle weakness (generalized)  Localized edema    Problem List There are no problems to display for this patient.   Rosalyn Gess OTR/L., CLT 02/25/2021, 5:11 PM  [facility] [address], [place], [address] Phone: [phone]   Fax:  [phone]  Name: Teresa Rodriguez MRN: [id number] Date of Birth: [birth year]

## 2021-03-04 ENCOUNTER — Other Ambulatory Visit: Payer: Self-pay

## 2021-03-04 ENCOUNTER — Ambulatory Visit: Payer: Managed Care, Other (non HMO) | Attending: Orthopedic Surgery | Admitting: Occupational Therapy

## 2021-03-04 DIAGNOSIS — M25632 Stiffness of left wrist, not elsewhere classified: Secondary | ICD-10-CM | POA: Insufficient documentation

## 2021-03-04 DIAGNOSIS — M6281 Muscle weakness (generalized): Secondary | ICD-10-CM | POA: Diagnosis present

## 2021-03-04 DIAGNOSIS — M25642 Stiffness of left hand, not elsewhere classified: Secondary | ICD-10-CM | POA: Insufficient documentation

## 2021-03-04 DIAGNOSIS — R600 Localized edema: Secondary | ICD-10-CM | POA: Diagnosis present

## 2021-03-04 NOTE — Therapy (Signed)
[facility] [address], [place], [address] Phone: [phone]   Fax:  [phone]  Occupational Therapy Treatment  Patient Details  Name: MIRAYAH WREN MRN: [id number] Date of Birth: 1971-11-23 Referring Provider (OT): Dr Posey Pronto   Encounter Date: 03/04/2021   OT End of Session - 03/04/21 1850    Visit Number 4    Number of Visits 8    Date for OT Re-Evaluation 03/29/21    OT Start Time 2993    OT Stop Time 1527    OT Time Calculation (min) 42 min    Activity Tolerance Patient tolerated treatment well    Behavior During Therapy [facility] for tasks assessed/performed           Past Medical History:  Diagnosis Date  . COVID-19 06/2020  . GERD (gastroesophageal reflux disease)   . Headache   . Hypertriglyceridemia   . Seasonal allergies     Past Surgical History:  Procedure Laterality Date  . OVARIAN CYST REMOVAL Right   . SHOULDER ARTHROSCOPY WITH ROTATOR CUFF REPAIR AND SUBACROMIAL DECOMPRESSION Left 12/07/2020   Procedure: Left shoulder mini-open rotator cuff repair, arthroscopic biceps tenodesis, and subacromial decompression;  Surgeon: Leim Fabry, MD;  Location: [facility];  Service: Orthopedics;  Laterality: Left;  Reche Dixon to Assist    There were no vitals filed for this visit.   Subjective Assessment - 03/04/21 1847    Subjective  Did you see Dr Serita Grit note- want me to get MRI - because I cannot pick up or lift my arm yet- swelling/fullness in upper arm feels better-  and wrist motion better- my thumb still hurts at time down here    Pertinent History DR Posey Pronto did left shoulder arthroscopy 12/07/20     12/07/20:  PROCEDURES:   1. Left mini open rotator cuff repair  2. Left arthroscopic biceps tenodesis  3. Left subacromial decompression  4. Left extensive debridement of shoulder (glenohumeral and subacromial spaces)    History of Present Illness:  02/02/2021:  She was on 02/01/21  approximately 8 weeks after undergoing above procedures. She returns to the office earlier than anticipated due to persistent left hand and forearm swelling and difficulty using her fingers. She also notes occasional color changes if her hand is in a dependent position for prolonged period of time. She also notes that the left hand feels warmer than the right hand frequently. She does not have exquisite pain in her hands although there are certain areas that are more tender than what she would expect - refer to hand therapy - ? CRPS    Patient Stated Goals Want to get the swelling and heavy feeling better so I can use my hand    Currently in Pain? Yes    Pain Score 3     Pain Location Shoulder    Pain Orientation Left    Pain Type Surgical pain    Pain Onset More than a month ago               LYMPHEDEMA/ONCOLOGY QUESTIONNAIRE - 03/04/21 0001      Left Upper Extremity Lymphedema   15 cm Proximal to Olecranon Process 28.4 cm    10 cm Proximal to Olecranon Process 26.4 cm    Olecranon Process 26 cm    15 cm Proximal to Ulnar Styloid Process 25 cm    10 cm Proximal to Ulnar Styloid Process 21.5 cm    Just Proximal  to Ulnar Styloid Process 17.4 cm    Across Hand at [employer] 18.5 cm    At [place] of 2nd Digit 6.8 cm    At [facility] of Thumb 6.2 cm              Measurements taken of L UE - great  progress in decongestion of L UE in 3 sessions  Feel very good - upper arm feels better and not has fibrotic than last time -but still some what lateral upper arm  scars still tender and adhesions  Increased AROM in wrist , forearm and hand  Cont to have some pain and stiffness in wrist flexion , extention and composite flexion -and thumb RA-  Pt pain over distal radius and positive Finkelstein test on L -  Will ask Dr Posey Pronto ionto order   10 reps  AAROM for wrist flexion , extention over armrest and RD, UD AROM at side 10reps  Sup and pronation with elbow supported on armrest- 10  reps Cont tendon glides and thumb PA and RA- opposition  Pain free  10 reps   Pt ed on modifications -to not do wide grip or static grip or push or pull with thumb- use palm or forearm or fist more than thumb            OT Treatments/Exercises (OP) - 03/04/21 0001      Ultrasound   Ultrasound Location R 1st dorsal compartment    Ultrasound Parameters 3.3MHZ, 20%, 0.8 intensity    Ultrasound Goals Pain      LUE Contrast Bath   Time 8 minutes    Comments L shoulder and upper arm prior to soft tissue          Pt in side lying and L UE supported - done contrast on upper arm and shoulder- decrease pain   Review with pt againManual Lymph Drainage  Do manual lymph drainage once each day to help decrease swelling. This should take you about 46minutes depending on the size of your limb. 1. Pump  Shoulder to upper traps 15 reps  2Pump up the outside of left upper arm 8 times 3 PUmp inside to outside of upper arm and outside of upper arm to shoulder 8 reps each   Rework several times  Done mini massager over lateral upper arm - gentle -and scars   tolerated well   Upper arm not as fibrotic and brawny   Pt to do daily until next time   LOW and LIGHT with only your palm NOT FINGERTIPS done the MLD     Pt to contwith medium isotoner glove and fitted with newtubigrip D from hand to elbow -and then for daytime can add tubigrip E from forearm to axilla- making sure no tourniquet on upper arm- new Tubigrip E provided too  AND supination to side - keep out to side           OT Education - 03/04/21 1850    Education Details progress and changes to HEP    Person(s) Educated Patient            OT Short Term Goals - 02/15/21 1552      OT SHORT TERM GOAL #1   Title Pt to be independent in MLD and wearing of compression to decrease L UE circumference by 1 cm in wrist and forearm and 0.5 cm in upperarm and hand to show increase digits and wrist  AROM    Baseline Circumference increase  in L  hand and digits by 0.6 cm, wrist and forearm 2.2cm, elbow 0.9 cm and upper arm 0.4 cm - AROM in digits and wrist decrease see flowsheet    Time 3    Period Weeks    Status New    Target Date 03/08/21             OT Long Term Goals - 02/15/21 1555      OT LONG TERM GOAL #1   Title Pt edema in L UE decrease to less than 0.5 from digits to upper arm to be able to use hand in gripping ADL's objects, utencils and increase ease  on computer    Baseline increase from digits to upperarm - compare to R UE - see STG - not able to hold or girp objects - one finger at time on computer    Time 4    Period Weeks    Status New    Target Date 03/15/21      OT LONG TERM GOAL #2   Title L digits and wrist AROM increase to WNL to grip toys to  play with granddaugther ,  grip objects in kitchen at waist height    Baseline unable to use L hand to play or use at home- stiffness in wrist and digits - Wrist ext 35, flexion 50 , UD 25, RD 15 - MC 60-80's and PIP's 90-95's    Time 6    Period Weeks    Status New    Target Date 03/29/21      OT LONG TERM GOAL #3   Title Initiate grip strength and prehension in L hand    Baseline Only edema control and AROM at this time in L hand -    Time 4    Period Weeks    Status New    Target Date 03/15/21      OT LONG TERM GOAL #4   Title L elbow AROM increase to WNL to use on computer and use with bathing and dressing    Baseline feel pull  -cannot keep all the way extended the L elbow, supination decrease - cannot use on computer - only one finger at time- cannot use for bathing and dressing little    Time 5    Period Weeks    Status New    Target Date 03/22/21                 Plan - 03/04/21 1850    Clinical Impression Statement Pt is about 12 1/2 wks s/p R L shoulder arthroscopy - pt made great progress in decreasing edema or circumference in L UE - and after last week she showed decrease fibrosis in  lateral upper arm - pt show increase wrist and forearm AROM - wrist flexion ,ext still decrease and composite fist - as well as thumb RA- pt report pain with thumb -and upon further assessment - pt had tenderness over distal radius head and Positive Finkelstein test- pt probably was compensating for while with lateral grip while she could not do supination and gripping- will request ionto order from Dr Posey Pronto - to over 1st dorsal compartment to decrease pain    OT Occupational Profile and History Problem Focused Assessment - Including review of records relating to presenting problem    Occupational performance deficits (Please refer to evaluation for details): ADL's;IADL's;Rest and Sleep;Work;Play;Leisure;Social Participation    Body Structure / Function / Physical Skills ADL;Coordination;IADL;Pain;Strength;Edema;Dexterity;FMC;Flexibility;ROM;UE functional use  Clinical Decision Making Several treatment options, min-mod task modification necessary    Comorbidities Affecting Occupational Performance: None    Modification or Assistance to Complete Evaluation  Min-Moderate modification of tasks or assist with assess necessary to complete eval    OT Frequency 2x / week    OT Duration 6 weeks    OT Treatment/Interventions Self-care/ADL training;Contrast Bath;Therapeutic exercise;Manual lymph drainage;Patient/family education;Splinting;Compression bandaging;Manual Therapy;Passive range of motion    Consulted and Agree with Plan of Care Patient           Patient will benefit from skilled therapeutic intervention in order to improve the following deficits and impairments:   Body Structure / Function / Physical Skills: ADL,Coordination,IADL,Pain,Strength,Edema,Dexterity,FMC,Flexibility,ROM,UE functional use       Visit Diagnosis: Stiffness of left wrist, not elsewhere classified  Stiffness of left hand, not elsewhere classified  Muscle weakness (generalized)  Localized edema    Problem  List There are no problems to display for this patient.   Rosalyn Gess OTR/l,CLT 03/04/2021, 6:56 PM  [facility] [address] [place], [address] Phone: [phone]   Fax:  [phone]  Name: MEIGAN PATES MRN: [id number] Date of Birth: 12/05/1970

## 2021-03-08 ENCOUNTER — Other Ambulatory Visit: Payer: Self-pay | Admitting: Orthopedic Surgery

## 2021-03-08 ENCOUNTER — Other Ambulatory Visit (HOSPITAL_COMMUNITY): Payer: Self-pay | Admitting: Orthopedic Surgery

## 2021-03-08 DIAGNOSIS — S46012A Strain of muscle(s) and tendon(s) of the rotator cuff of left shoulder, initial encounter: Secondary | ICD-10-CM

## 2021-03-09 ENCOUNTER — Ambulatory Visit: Payer: Managed Care, Other (non HMO) | Admitting: Occupational Therapy

## 2021-03-09 ENCOUNTER — Other Ambulatory Visit: Payer: Self-pay

## 2021-03-09 DIAGNOSIS — M25642 Stiffness of left hand, not elsewhere classified: Secondary | ICD-10-CM

## 2021-03-09 DIAGNOSIS — M6281 Muscle weakness (generalized): Secondary | ICD-10-CM

## 2021-03-09 DIAGNOSIS — M25632 Stiffness of left wrist, not elsewhere classified: Secondary | ICD-10-CM | POA: Diagnosis not present

## 2021-03-09 DIAGNOSIS — R600 Localized edema: Secondary | ICD-10-CM

## 2021-03-09 NOTE — Therapy (Signed)
[facility] [address], [place], [address] Phone: [phone]   Fax:  [phone]  Occupational Therapy Treatment  Patient Details  Name: NARCISA GANESH MRN: [id number] Date of Birth: August 25, 1971 Referring Provider (OT): Dr Posey Pronto   Encounter Date: 03/09/2021   OT End of Session - 03/09/21 1420    Visit Number 5    Number of Visits 8    Date for OT Re-Evaluation 03/29/21    OT Start Time 1401    OT Stop Time 1439    OT Time Calculation (min) 38 min    Activity Tolerance Patient tolerated treatment well    Behavior During Therapy [facility] for tasks assessed/performed           Past Medical History:  Diagnosis Date  . COVID-19 06/2020  . GERD (gastroesophageal reflux disease)   . Headache   . Hypertriglyceridemia   . Seasonal allergies     Past Surgical History:  Procedure Laterality Date  . OVARIAN CYST REMOVAL Right   . SHOULDER ARTHROSCOPY WITH ROTATOR CUFF REPAIR AND SUBACROMIAL DECOMPRESSION Left 12/07/2020   Procedure: Left shoulder mini-open rotator cuff repair, arthroscopic biceps tenodesis, and subacromial decompression;  Surgeon: Leim Fabry, MD;  Location: [facility];  Service: Orthopedics;  Laterality: Left;  Reche Dixon to Assist    There were no vitals filed for this visit.   Subjective Assessment - 03/09/21 1418    Subjective  MRI is schedule for Sunday and then appt with Dr Belinda Block- and been ache more on the upper arm - front - like pulling sensation- tried again laying down in bed - could not    Pertinent History DR Posey Pronto did left shoulder arthroscopy 12/07/20     12/07/20:  PROCEDURES:   1. Left mini open rotator cuff repair  2. Left arthroscopic biceps tenodesis  3. Left subacromial decompression  4. Left extensive debridement of shoulder (glenohumeral and subacromial spaces)    History of Present Illness:  02/02/2021:  She was on 02/01/21 approximately 8 weeks after undergoing  above procedures. She returns to the office earlier than anticipated due to persistent left hand and forearm swelling and difficulty using her fingers. She also notes occasional color changes if her hand is in a dependent position for prolonged period of time. She also notes that the left hand feels warmer than the right hand frequently. She does not have exquisite pain in her hands although there are certain areas that are more tender than what she would expect - refer to hand therapy - ? CRPS    Patient Stated Goals Want to get the swelling and heavy feeling better so I can use my hand    Currently in Pain? Yes    Pain Score 4     Pain Location Shoulder    Pain Orientation Left    Pain Descriptors / Indicators Sore;Aching    Pain Type Surgical pain    Pain Onset More than a month ago    Pain Frequency Constant               LYMPHEDEMA/ONCOLOGY QUESTIONNAIRE - 03/09/21 0001      Left Upper Extremity Lymphedema   15 cm Proximal to Olecranon Process 28.4 cm    10 cm Proximal to Olecranon Process 27 cm    Olecranon Process 26 cm    15 cm Proximal to Ulnar Styloid Process 24.8 cm    10 cm Proximal to Ulnar Styloid  Process 22 cm    Just Proximal to Ulnar Styloid Process 17.4 cm    Across Hand at [employer] 19 cm    At [place] of 2nd Digit 6.8 cm    At [facility] of Thumb 6.2 cm                  Measurements taken of L UE -greatprogress indecongestion of L UE in 3 sessions - fitted with new small isotoner glove this date for daytime -and medium for night time Feel very good - upper arm feels better and not has fibrotic than before -but still some what lateral upper arm  scars still tender and adhesions- and soreness and achiness in upper arm and shoulder- scheduled for MRI Sunday Pain pain and stiffness in wristflexion , extention andcomposite flexion -and thumb RA-  Pt pain over distal radius and positive Finkelstein test on L - DId get last Friday the 8th April verbal  order from Dr Posey Pronto for ionto with dexamethazone use for 1st dorsal compartment     OT Treatments/Exercises (OP) - 03/09/21 0001      Iontophoresis   Type of Iontophoresis Dexamethasone    Location L 1 st dorsal compartment    Dose med patch, 2.0 current,    Time 19      LUE Contrast Bath   Time 8 minutes    Comments L hand and wrist          Skin check done prior and after wards- no issues - tolerate well     AAROM for wrist flexion , extention over armrest and RD, UD AROM at side 10reps  Sup and pronation with elbow supported on armrest- 10 reps Cont tendon glides and thumb PA and RA- opposition  Pain free  10 reps  Pt ed on modifications -to not do wide grip or static grip or push or pull with thumb- use palm or forearm or fist more than thumb          OT Education - 03/09/21 1420    Education Details progress and changes to HEP; ionto use    Person(s) Educated Patient    Methods Explanation;Demonstration;Tactile cues;Verbal cues;Handout    Comprehension Verbal cues required;Returned demonstration;Verbalized understanding            OT Short Term Goals - 02/15/21 1552      OT SHORT TERM GOAL #1   Title Pt to be independent in MLD and wearing of compression to decrease L UE circumference by 1 cm in wrist and forearm and 0.5 cm in upperarm and hand to show increase digits and wrist AROM    Baseline Circumference increase in L  hand and digits by 0.6 cm, wrist and forearm 2.2cm, elbow 0.9 cm and upper arm 0.4 cm - AROM in digits and wrist decrease see flowsheet    Time 3    Period Weeks    Status New    Target Date 03/08/21             OT Long Term Goals - 02/15/21 1555      OT LONG TERM GOAL #1   Title Pt edema in L UE decrease to less than 0.5 from digits to upper arm to be able to use hand in gripping ADL's objects, utencils and increase ease  on computer    Baseline increase from digits to upperarm - compare to R UE - see STG - not able to  hold or girp objects -  one finger at time on computer    Time 4    Period Weeks    Status New    Target Date 03/15/21      OT LONG TERM GOAL #2   Title L digits and wrist AROM increase to WNL to grip toys to  play with granddaugther ,  grip objects in kitchen at waist height    Baseline unable to use L hand to play or use at home- stiffness in wrist and digits - Wrist ext 35, flexion 50 , UD 25, RD 15 - MC 60-80's and PIP's 90-95's    Time 6    Period Weeks    Status New    Target Date 03/29/21      OT LONG TERM GOAL #3   Title Initiate grip strength and prehension in L hand    Baseline Only edema control and AROM at this time in L hand -    Time 4    Period Weeks    Status New    Target Date 03/15/21      OT LONG TERM GOAL #4   Title L elbow AROM increase to WNL to use on computer and use with bathing and dressing    Baseline feel pull  -cannot keep all the way extended the L elbow, supination decrease - cannot use on computer - only one finger at time- cannot use for bathing and dressing little    Time 5    Period Weeks    Status New    Target Date 03/22/21                 Plan - 03/09/21 1421    Clinical Impression Statement Pt is about 13 wks s/p R L shoulder arthroscopy -schedule Sunday for MRI for shoulder -  pt made great progress in decreasing edema or circumference in L UE - and the last week and 1/2  she showed decrease fibrosis in lateral upper arm -  but she report soreness and achiness in upper arm and shoulder - pt show increase wrist and forearm AROM - wrist flexion ,ext still decrease and composite fist - as well as thumb RA- pt report pain with thumb RA -and upon further assessment last week  - pt had tenderness over distal radius head and Positive [place] test- Did get verbal order last Friday - 8th April from [place] -  to do ionto with dexamethazone-initiated this date - pt tolerate well - was fitted with a new isotoner glove for hand and wrist -     OT Occupational Profile and History Problem Focused Assessment - Including review of records relating to presenting problem    Occupational performance deficits (Please refer to evaluation for details): ADL's;IADL's;Rest and Sleep;Work;Play;Leisure;Social Participation    Body Structure / Function / Physical Skills ADL;Coordination;IADL;Pain;Strength;Edema;Dexterity;FMC;Flexibility;ROM;UE functional use    Rehab Potential Good    Clinical Decision Making Several treatment options, min-mod task modification necessary    Comorbidities Affecting Occupational Performance: None    Modification or Assistance to Complete Evaluation  Min-Moderate modification of tasks or assist with assess necessary to complete eval    OT Frequency 2x / week    OT Duration 6 weeks    OT Treatment/Interventions Self-care/ADL training;Contrast Bath;Therapeutic exercise;Manual lymph drainage;Patient/family education;Splinting;Compression bandaging;Manual Therapy;Passive range of motion    Consulted and Agree with Plan of Care Patient           Patient will benefit from skilled therapeutic intervention in order to  improve the following deficits and impairments:   Body Structure / Function / Physical Skills: ADL,Coordination,IADL,Pain,Strength,Edema,Dexterity,FMC,Flexibility,ROM,UE functional use       Visit Diagnosis: Stiffness of left hand, not elsewhere classified  Muscle weakness (generalized)  Localized edema  Stiffness of left wrist, not elsewhere classified    Problem List There are no problems to display for this patient.   Rosalyn Gess OTR/L,CLT 03/09/2021, 3:38 PM  [facility] [address] [place], [address] Phone: [phone]   Fax:  [phone]  Name: BONNE WHACK MRN: [id number] Date of Birth: [birth year]

## 2021-03-11 ENCOUNTER — Other Ambulatory Visit: Payer: Self-pay

## 2021-03-11 ENCOUNTER — Ambulatory Visit: Payer: Managed Care, Other (non HMO) | Admitting: Occupational Therapy

## 2021-03-11 DIAGNOSIS — M25642 Stiffness of left hand, not elsewhere classified: Secondary | ICD-10-CM

## 2021-03-11 DIAGNOSIS — M6281 Muscle weakness (generalized): Secondary | ICD-10-CM

## 2021-03-11 DIAGNOSIS — M25632 Stiffness of left wrist, not elsewhere classified: Secondary | ICD-10-CM

## 2021-03-11 DIAGNOSIS — R600 Localized edema: Secondary | ICD-10-CM

## 2021-03-11 NOTE — Therapy (Signed)
[facility] [address] [place], [address] Phone: [phone]   Fax:  [phone]  Occupational Therapy Treatment  Patient Details  Name: KATHERINNE MOFIELD MRN: [id number] Date of Birth: 1971/04/30 Referring Provider (OT): Dr Posey Pronto   Encounter Date: 03/11/2021   OT End of Session - 03/11/21 1358    Visit Number 6    Number of Visits 8    Date for OT Re-Evaluation 03/29/21    OT Start Time 1400    OT Stop Time 1448    OT Time Calculation (min) 48 min    Activity Tolerance Patient tolerated treatment well    Behavior During Therapy [facility] for tasks assessed/performed           Past Medical History:  Diagnosis Date  . COVID-19 06/2020  . GERD (gastroesophageal reflux disease)   . Headache   . Hypertriglyceridemia   . Seasonal allergies     Past Surgical History:  Procedure Laterality Date  . OVARIAN CYST REMOVAL Right   . SHOULDER ARTHROSCOPY WITH ROTATOR CUFF REPAIR AND SUBACROMIAL DECOMPRESSION Left 12/07/2020   Procedure: Left shoulder mini-open rotator cuff repair, arthroscopic biceps tenodesis, and subacromial decompression;  Surgeon: Leim Fabry, MD;  Location: [facility];  Service: Orthopedics;  Laterality: Left;  Reche Dixon to Assist    There were no vitals filed for this visit.   Subjective Assessment - 03/11/21 1359    Subjective  MRI still Sunday -and doing about same- can tell the tighter glove working better - hand not as swollen -    Pertinent History DR Posey Pronto did left shoulder arthroscopy 12/07/20     12/07/20:  PROCEDURES:   1. Left mini open rotator cuff repair  2. Left arthroscopic biceps tenodesis  3. Left subacromial decompression  4. Left extensive debridement of shoulder (glenohumeral and subacromial spaces)    History of Present Illness:  02/02/2021:  She was on 02/01/21 approximately 8 weeks after undergoing above procedures. She returns to the office earlier than anticipated  due to persistent left hand and forearm swelling and difficulty using her fingers. She also notes occasional color changes if her hand is in a dependent position for prolonged period of time. She also notes that the left hand feels warmer than the right hand frequently. She does not have exquisite pain in her hands although there are certain areas that are more tender than what she would expect - refer to hand therapy - ? CRPS    Patient Stated Goals Want to get the swelling and heavy feeling better so I can use my hand    Currently in Pain? Yes    Pain Score 1     Pain Location Shoulder    Pain Orientation Left    Pain Descriptors / Indicators Aching;Sore    Pain Type Surgical pain    Pain Onset More than a month ago    Pain Frequency Constant               LYMPHEDEMA/ONCOLOGY QUESTIONNAIRE - 03/11/21 0001      Left Upper Extremity Lymphedema   15 cm Proximal to Olecranon Process 28 cm    10 cm Proximal to Olecranon Process 26 cm    Olecranon Process 26 cm    15 cm Proximal to Ulnar Styloid Process 24.2 cm    10 cm Proximal to Ulnar Styloid Process 21.3 cm    Just Proximal to Ulnar Styloid Process 17.4 cm  Across Hand at [employer] 18.2 cm               Measurements taken of L UE -greatprogress indecongestion of L UE - fitted with new small isotoner glove last visit for daytime -and medium for night time  scheduled for MRI Sunday Pain  and stiffness in wristflexion , extentionandcomposite flexion digits -and thumbRA Pt pain over distal radius and positive Finkelstein test on L - DId get last Friday the 8th April verbal order from Dr Posey Pronto for [place] with dexamethazone use for 1st dorsal compartment    AAROM for wrist flexion , extention over armrest and RD, UD AROMat side 10reps Sup and pronation with elbow supported on armrest- 10 reps Cont tendon glides and thumb PA and RA- opposition Add some isometric for thumb in all planes - 12 reps  And putty  light blue for gripping ony 12 reps 2 x day  Pain free       Skin check done prior - no issues - tolerate well - pt to keep on for hour afterwards       OT Treatments/Exercises (OP) - 03/11/21 0001      Iontophoresis   Type of Iontophoresis Dexamethasone    Location L 1 st dorsal compartment    Dose med patch, 2.0 current,    Time 19      LUE Contrast Bath   Time 8 minutes    Comments L wrist and hand prior to ROM and softissue           Done some soft tissue mobs to hand , webspace and MC spreads         OT Education - 03/11/21 1358    Education Details progress and changes to HEP; ionto use    Person(s) Educated Patient    Methods Explanation;Demonstration;Tactile cues;Verbal cues;Handout    Comprehension Verbal cues required;Returned demonstration;Verbalized understanding            OT Short Term Goals - 02/15/21 1552      OT SHORT TERM GOAL #1   Title Pt to be independent in MLD and wearing of compression to decrease L UE circumference by 1 cm in wrist and forearm and 0.5 cm in upperarm and hand to show increase digits and wrist AROM    Baseline Circumference increase in L  hand and digits by 0.6 cm, wrist and forearm 2.2cm, elbow 0.9 cm and upper arm 0.4 cm - AROM in digits and wrist decrease see flowsheet    Time 3    Period Weeks    Status New    Target Date 03/08/21             OT Long Term Goals - 02/15/21 1555      OT LONG TERM GOAL #1   Title Pt edema in L UE decrease to less than 0.5 from digits to upper arm to be able to use hand in gripping ADL's objects, utencils and increase ease  on computer    Baseline increase from digits to upperarm - compare to R UE - see STG - not able to hold or girp objects - one finger at time on computer    Time 4    Period Weeks    Status New    Target Date 03/15/21      OT LONG TERM GOAL #2   Title L digits and wrist AROM increase to WNL to grip toys to  play with granddaugther ,  grip  objects in  kitchen at waist height    Baseline unable to use L hand to play or use at home- stiffness in wrist and digits - Wrist ext 35, flexion 50 , UD 25, RD 15 - MC 60-80's and PIP's 90-95's    Time 6    Period Weeks    Status New    Target Date 03/29/21      OT LONG TERM GOAL #3   Title Initiate grip strength and prehension in L hand    Baseline Only edema control and AROM at this time in L hand -    Time 4    Period Weeks    Status New    Target Date 03/15/21      OT LONG TERM GOAL #4   Title L elbow AROM increase to WNL to use on computer and use with bathing and dressing    Baseline feel pull  -cannot keep all the way extended the L elbow, supination decrease - cannot use on computer - only one finger at time- cannot use for bathing and dressing little    Time 5    Period Weeks    Status New    Target Date 03/22/21                 Plan - 03/11/21 1358    Clinical Impression Statement Pt is about 13 1/2  wks s/p R L shoulder arthroscopy -schedule Sunday for MRI for shoulder -  pt made great progress in decreasing edema or circumference in L UE - and the last week and 1/2  she showed decrease fibrosis in lateral upper arm -  but she report soreness and achiness in upper arm and shoulder - pt show increase wrist and forearm AROM - wrist flexion ,ext still decrease and composite fist - as well as thumb RA- pt report pain with thumb RA -and upon further assessment last week  - pt had tenderness over distal radius head and Positive Finkelstein test- Did get verbal order last Friday - 8th April from [place] -  to do ionto with dexamethazone-2nd session this date  - pt tolerate well - was fitted with a new isotoner glove for hand and wrist lat time -and add some putty to grip and isometric for thumb    OT Occupational Profile and History Problem Focused Assessment - Including review of records relating to presenting problem    Occupational performance deficits (Please refer to evaluation for  details): ADL's;IADL's;Rest and Sleep;Work;Play;Leisure;Social Participation    Body Structure / Function / Physical Skills ADL;Coordination;IADL;Pain;Strength;Edema;Dexterity;FMC;Flexibility;ROM;UE functional use    Rehab Potential Good    Clinical Decision Making Several treatment options, min-mod task modification necessary    Comorbidities Affecting Occupational Performance: None    Modification or Assistance to Complete Evaluation  Min-Moderate modification of tasks or assist with assess necessary to complete eval    OT Frequency 2x / week    OT Treatment/Interventions Self-care/ADL training;Contrast Bath;Therapeutic exercise;Manual lymph drainage;Patient/family education;Splinting;Compression bandaging;Manual Therapy;Passive range of motion    Consulted and Agree with Plan of Care Patient           Patient will benefit from skilled therapeutic intervention in order to improve the following deficits and impairments:   Body Structure / Function / Physical Skills: ADL,Coordination,IADL,Pain,Strength,Edema,Dexterity,FMC,Flexibility,ROM,UE functional use       Visit Diagnosis: Stiffness of left hand, not elsewhere classified  Muscle weakness (generalized)  Localized edema  Stiffness of left wrist, not elsewhere classified  Problem List There are no problems to display for this patient.   Rosalyn Gess OTR/L,CLT 03/11/2021, 5:12 PM  [facility] [address] [place], [address] Phone: [phone]   Fax:  [phone]  Name: TEREN FRANCKOWIAK MRN: [id number] Date of Birth: [birth year]

## 2021-03-14 ENCOUNTER — Other Ambulatory Visit: Payer: Self-pay

## 2021-03-14 ENCOUNTER — Ambulatory Visit
Admission: RE | Admit: 2021-03-14 | Discharge: 2021-03-14 | Disposition: A | Discharge status: Home / Self Care | Payer: Managed Care, Other (non HMO) | Source: Ambulatory Visit | Attending: Orthopedic Surgery | Admitting: Orthopedic Surgery

## 2021-03-14 DIAGNOSIS — S46012A Strain of muscle(s) and tendon(s) of the rotator cuff of left shoulder, initial encounter: Secondary | ICD-10-CM | POA: Insufficient documentation

## 2021-03-15 ENCOUNTER — Other Ambulatory Visit: Payer: Self-pay

## 2021-03-15 ENCOUNTER — Ambulatory Visit: Payer: Managed Care, Other (non HMO) | Admitting: Occupational Therapy

## 2021-03-15 DIAGNOSIS — M25632 Stiffness of left wrist, not elsewhere classified: Secondary | ICD-10-CM

## 2021-03-15 DIAGNOSIS — M25642 Stiffness of left hand, not elsewhere classified: Secondary | ICD-10-CM

## 2021-03-15 DIAGNOSIS — M6281 Muscle weakness (generalized): Secondary | ICD-10-CM

## 2021-03-15 DIAGNOSIS — R600 Localized edema: Secondary | ICD-10-CM

## 2021-03-18 ENCOUNTER — Ambulatory Visit: Payer: Managed Care, Other (non HMO) | Admitting: Occupational Therapy

## 2021-03-18 ENCOUNTER — Other Ambulatory Visit: Payer: Self-pay

## 2021-03-18 DIAGNOSIS — M25642 Stiffness of left hand, not elsewhere classified: Secondary | ICD-10-CM

## 2021-03-18 DIAGNOSIS — M6281 Muscle weakness (generalized): Secondary | ICD-10-CM

## 2021-03-18 DIAGNOSIS — M25632 Stiffness of left wrist, not elsewhere classified: Secondary | ICD-10-CM

## 2021-03-18 DIAGNOSIS — R600 Localized edema: Secondary | ICD-10-CM

## 2021-03-19 ENCOUNTER — Encounter: Payer: Self-pay | Admitting: Occupational Therapy

## 2021-03-19 NOTE — Therapy (Signed)
[place] [address] [place], [address] Phone: [phone]   Fax:  [phone]  Occupational Therapy Treatment  Patient Details  Name: Teresa Rodriguez MRN: [id number] Date of Birth: 04-Feb-1971 Referring Provider (OT): Dr Posey Pronto   Encounter Date: 03/15/2021   OT End of Session - 03/19/21 1652    Visit Number 7    Number of Visits 8    Date for OT Re-Evaluation 03/29/21    OT Start Time 1519    OT Stop Time 1600    OT Time Calculation (min) 41 min    Activity Tolerance Patient tolerated treatment well    Behavior During Therapy [facility] for tasks assessed/performed           Past Medical History:  Diagnosis Date  . COVID-19 06/2020  . GERD (gastroesophageal reflux disease)   . Headache   . Hypertriglyceridemia   . Seasonal allergies     Past Surgical History:  Procedure Laterality Date  . OVARIAN CYST REMOVAL Right   . SHOULDER ARTHROSCOPY WITH ROTATOR CUFF REPAIR AND SUBACROMIAL DECOMPRESSION Left 12/07/2020   Procedure: Left shoulder mini-open rotator cuff repair, arthroscopic biceps tenodesis, and subacromial decompression;  Surgeon: Leim Fabry, MD;  Location: [place];  Service: Orthopedics;  Laterality: Left;  Reche Dixon to Assist    There were no vitals filed for this visit.   Subjective Assessment - 03/19/21 1649    Subjective  Pt reports she had her MRI on Sunday.  Wearing compression glove, tubigrip sleeves, improving with edema, still some edema at the fingers.  Tendon glides, thumb opposition to small finger with effort.  No pain today in hand, pain in shoulder 2/10, MRI results are in my chart and will see the MD tomorrow.    Pertinent History DR Posey Pronto did left shoulder arthroscopy 12/07/20     12/07/20:  PROCEDURES:   1. Left mini open rotator cuff repair  2. Left arthroscopic biceps tenodesis  3. Left subacromial decompression  4. Left extensive debridement of shoulder  (glenohumeral and subacromial spaces)    History of Present Illness:  02/02/2021:  She was on 02/01/21 approximately 8 weeks after undergoing above procedures. She returns to the office earlier than anticipated due to persistent left hand and forearm swelling and difficulty using her fingers. She also notes occasional color changes if her hand is in a dependent position for prolonged period of time. She also notes that the left hand feels warmer than the right hand frequently. She does not have exquisite pain in her hands although there are certain areas that are more tender than what she would expect - refer to hand therapy - ? CRPS    Patient Stated Goals Want to get the swelling and heavy feeling better so I can use my hand    Currently in Pain? Yes    Pain Score 2     Pain Location Shoulder    Pain Orientation Left    Pain Descriptors / Indicators Aching    Pain Type Chronic pain    Pain Onset More than a month ago    Pain Frequency Constant            Contrast with hot and cold packs performed alternating warm for 3 mins, cold for 1 min for total of 12 mins to decrease edema, decrease pain and increase motion.  Performed prior to therapeutic exercises.    Measurements taken of wrist motion, see flow sheet  Stiffness in wrist flexion , extension and composite flexion digits, thumb RA Pt pain over distal radius and positive Finkelstein test on L  AAROM for wrist flexion, extension over armrest and RD, UD AROM at side 10reps  Sup and pronation with elbow supported on armrest- 10 reps Tendon glides and thumb PA and RA- opposition  Isometric for thumb in all planes - 12 reps   [facility] OT Assessment - 03/19/21 1655      Assessment   Medical Diagnosis Edema L UE/CRPS    Referring Provider (OT) Dr Posey Pronto    Onset Date/Surgical Date 12/07/20    Hand Dominance Right      AROM   Left Forearm Pronation 90 Degrees    Left Forearm Supination 90 Degrees    Left Wrist Extension 52 Degrees    Left  Wrist Flexion 60 Degrees    Left Wrist Radial Deviation 25 Degrees    Left Wrist Ulnar Deviation 20 Degrees                        OT Treatments/Exercises (OP) - 03/19/21 1702      Iontophoresis   Type of Iontophoresis Dexamethasone    Location L 1 st dorsal compartment    Dose med patch, 2.0 current,    Time 19           Skin check done prior - no issues - tolerate well- pt to keep on for hour afterwards          OT Education - 03/19/21 1651    Education Details progress and changes to HEP; ionto use    Person(s) Educated Patient    Methods Explanation;Demonstration;Tactile cues;Verbal cues;Handout    Comprehension Verbal cues required;Returned demonstration;Verbalized understanding            OT Short Term Goals - 02/15/21 1552      OT SHORT TERM GOAL #1   Title Pt to be independent in MLD and wearing of compression to decrease L UE circumference by 1 cm in wrist and forearm and 0.5 cm in upperarm and hand to show increase digits and wrist AROM    Baseline Circumference increase in L  hand and digits by 0.6 cm, wrist and forearm 2.2cm, elbow 0.9 cm and upper arm 0.4 cm - AROM in digits and wrist decrease see flowsheet    Time 3    Period Weeks    Status New    Target Date 03/08/21             OT Long Term Goals - 02/15/21 1555      OT LONG TERM GOAL #1   Title Pt edema in L UE decrease to less than 0.5 from digits to upper arm to be able to use hand in gripping ADL's objects, utencils and increase ease  on computer    Baseline increase from digits to upperarm - compare to R UE - see STG - not able to hold or girp objects - one finger at time on computer    Time 4    Period Weeks    Status New    Target Date 03/15/21      OT LONG TERM GOAL #2   Title L digits and wrist AROM increase to WNL to grip toys to  play with granddaugther ,  grip objects in kitchen at waist height    Baseline unable to use L hand to play or use at home- stiffness  in  wrist and digits - Wrist ext 35, flexion 50 , UD 25, RD 15 - MC 60-80's and PIP's 90-95's    Time 6    Period Weeks    Status New    Target Date 03/29/21      OT LONG TERM GOAL #3   Title Initiate grip strength and prehension in L hand    Baseline Only edema control and AROM at this time in L hand -    Time 4    Period Weeks    Status New    Target Date 03/15/21      OT LONG TERM GOAL #4   Title L elbow AROM increase to WNL to use on computer and use with bathing and dressing    Baseline feel pull  -cannot keep all the way extended the L elbow, supination decrease - cannot use on computer - only one finger at time- cannot use for bathing and dressing little    Time 5    Period Weeks    Status New    Target Date 03/22/21                 Plan - 03/19/21 1653    Clinical Impression Statement Pt is about 14  wks s/p L shoulder arthroscopy.  She had an MRI yesterday, got results in my chart and will see MD tomorrow to discuss results and plan.  Pt demonstrates stiffness in left wrist and hand, continues to work on edema control and using tubigrip sleeves to LUE.  Decreased pain in hand this date.  Limited motion and functional use of LUE secondary to shoulder pain.  Continue OT to maximize safety and independence in necessary daily tasks.  Review MRI results and MD notes next session regarding shoulder.    OT Occupational Profile and History Problem Focused Assessment - Including review of records relating to presenting problem    Occupational performance deficits (Please refer to evaluation for details): ADL's;IADL's;Rest and Sleep;Work;Play;Leisure;Social Participation    Body Structure / Function / Physical Skills ADL;Coordination;IADL;Pain;Strength;Edema;Dexterity;FMC;Flexibility;ROM;UE functional use    Rehab Potential Good    Clinical Decision Making Several treatment options, min-mod task modification necessary    Comorbidities Affecting Occupational Performance: None     Modification or Assistance to Complete Evaluation  Min-Moderate modification of tasks or assist with assess necessary to complete eval    OT Frequency 2x / week    OT Treatment/Interventions Self-care/ADL training;Contrast Bath;Therapeutic exercise;Manual lymph drainage;Patient/family education;Splinting;Compression bandaging;Manual Therapy;Passive range of motion    Consulted and Agree with Plan of Care Patient           Patient will benefit from skilled therapeutic intervention in order to improve the following deficits and impairments:   Body Structure / Function / Physical Skills: ADL,Coordination,IADL,Pain,Strength,Edema,Dexterity,FMC,Flexibility,ROM,UE functional use       Visit Diagnosis: Stiffness of left hand, not elsewhere classified  Muscle weakness (generalized)  Stiffness of left wrist, not elsewhere classified  Localized edema    Problem List There are no problems to display for this patient.  Teresa Rodriguez Teresa Rodriguez, OTR/L, CLT  Teresa Rodriguez 03/19/2021, 5:23 PM  [place] [facility] [address], [place], [address] Phone: [phone]   Fax:  [phone]  Name: Teresa Rodriguez MRN: [id number] Date of Birth: 02/26/1971

## 2021-03-19 NOTE — Therapy (Signed)
[place] [address] [place], [address] Phone: [phone]   Fax:  [phone]  Occupational Therapy Treatment  Patient Details  Name: Teresa Rodriguez MRN: [id number] Date of Birth: [birth year] Referring Provider (OT): Dr Posey Pronto   Encounter Date: 03/18/2021   OT End of Session - 03/19/21 2015    Visit Number 8    Number of Visits 8    Date for OT Re-Evaluation 03/29/21    OT Start Time 1452    OT Stop Time 1535    OT Time Calculation (min) 43 min    Activity Tolerance Patient tolerated treatment well    Behavior During Therapy [facility] for tasks assessed/performed           Past Medical History:  Diagnosis Date  . COVID-19 06/2020  . GERD (gastroesophageal reflux disease)   . Headache   . Hypertriglyceridemia   . Seasonal allergies     Past Surgical History:  Procedure Laterality Date  . OVARIAN CYST REMOVAL Right   . SHOULDER ARTHROSCOPY WITH ROTATOR CUFF REPAIR AND SUBACROMIAL DECOMPRESSION Left 12/07/2020   Procedure: Left shoulder mini-open rotator cuff repair, arthroscopic biceps tenodesis, and subacromial decompression;  Surgeon: Leim Fabry, MD;  Location: [place];  Service: Orthopedics;  Laterality: Left;  Reche Dixon to Assist    There were no vitals filed for this visit.   Subjective Assessment - 03/19/21 2013    Subjective  Pt reports she went to the MD for MRI results, she does have a small tear but MD doesn't feel she needs surgery, referring her for EMG study based on her symptoms.    Pertinent History DR Posey Pronto did left shoulder arthroscopy 12/07/20     12/07/20:  PROCEDURES:   1. Left mini open rotator cuff repair  2. Left arthroscopic biceps tenodesis  3. Left subacromial decompression  4. Left extensive debridement of shoulder (glenohumeral and subacromial spaces)    History of Present Illness:  02/02/2021:  She was on 02/01/21 approximately 8 weeks after undergoing above  procedures. She returns to the office earlier than anticipated due to persistent left hand and forearm swelling and difficulty using her fingers. She also notes occasional color changes if her hand is in a dependent position for prolonged period of time. She also notes that the left hand feels warmer than the right hand frequently. She does not have exquisite pain in her hands although there are certain areas that are more tender than what she would expect - refer to hand therapy - ? CRPS    Patient Stated Goals Want to get the swelling and heavy feeling better so I can use my hand    Currently in Pain? Yes    Pain Score 4     Pain Location Shoulder    Pain Orientation Left    Pain Descriptors / Indicators Aching    Pain Onset More than a month ago    Pain Frequency Constant           4/10 shoulder left No pain at hand at beginning of session, some tightness Working to [employer], was using right hand only but couldn't manage the larger bags of soil Also planting cucumbers, green peppers Mom lives with pt and helped some with the raised bed for gardening.   Contrast for edema control  with use of fluidotherapy and cold alternating.  3 mins fluido, 2 min cold, repeat 2x and end with 4 mins fluido.  wristflexion , extensionandcomposite flexiondigits, thumbRA Pt pain over distal radius and positive Finkelstein test on L  AAROM for wrist flexion, extension over armrest and RD, UD AROMat side 10reps Sup and pronation with elbow supported on armrest- 10 reps Tendon glides and thumb PA and RA- opposition Isometric for thumb in all planes - 12 reps       [facility] OT Assessment - 03/19/21 2021      Assessment   Medical Diagnosis Edema L UE/CRPS    Referring Provider (OT) Dr Posey Pronto    Onset Date/Surgical Date 12/07/20    Hand Dominance Right      AROM   Left Forearm Pronation 90 Degrees    Left Forearm Supination 90 Degrees    Left Wrist Extension 52 Degrees    Left Wrist  Flexion 60 Degrees    Left Wrist Radial Deviation 25 Degrees    Left Wrist Ulnar Deviation 20 Degrees                    OT Treatments/Exercises (OP) - 03/19/21 2020      Iontophoresis   Type of Iontophoresis Dexamethasone    Location L 1 st dorsal compartment    Dose med patch, 2.0 current,    Time 19      LUE Fluidotherapy   Number Minutes Fluidotherapy 10 Minutes    LUE Fluidotherapy Location Hand;Wrist    Comments plus 4 mins of cold for contrast                  OT Education - 03/19/21 2015    Education Details progress and changes to HEP; ionto use    Person(s) Educated Patient    Methods Explanation;Demonstration;Tactile cues;Verbal cues;Handout    Comprehension Verbal cues required;Returned demonstration;Verbalized understanding            OT Short Term Goals - 02/15/21 1552      OT SHORT TERM GOAL #1   Title Pt to be independent in MLD and wearing of compression to decrease L UE circumference by 1 cm in wrist and forearm and 0.5 cm in upperarm and hand to show increase digits and wrist AROM    Baseline Circumference increase in L  hand and digits by 0.6 cm, wrist and forearm 2.2cm, elbow 0.9 cm and upper arm 0.4 cm - AROM in digits and wrist decrease see flowsheet    Time 3    Period Weeks    Status New    Target Date 03/08/21             OT Long Term Goals - 02/15/21 1555      OT LONG TERM GOAL #1   Title Pt edema in L UE decrease to less than 0.5 from digits to upper arm to be able to use hand in gripping ADL's objects, utencils and increase ease  on computer    Baseline increase from digits to upperarm - compare to R UE - see STG - not able to hold or girp objects - one finger at time on computer    Time 4    Period Weeks    Status New    Target Date 03/15/21      OT LONG TERM GOAL #2   Title L digits and wrist AROM increase to WNL to grip toys to  play with granddaugther ,  grip objects in kitchen at waist height    Baseline  unable to use L hand to play or use at  home- stiffness in wrist and digits - Wrist ext 35, flexion 50 , UD 25, RD 15 - MC 60-80's and PIP's 90-95's    Time 6    Period Weeks    Status New    Target Date 03/29/21      OT LONG TERM GOAL #3   Title Initiate grip strength and prehension in L hand    Baseline Only edema control and AROM at this time in L hand -    Time 4    Period Weeks    Status New    Target Date 03/15/21      OT LONG TERM GOAL #4   Title L elbow AROM increase to WNL to use on computer and use with bathing and dressing    Baseline feel pull  -cannot keep all the way extended the L elbow, supination decrease - cannot use on computer - only one finger at time- cannot use for bathing and dressing little    Time 5    Period Weeks    Status New    Target Date 03/22/21                 Plan - 03/19/21 2016    Clinical Impression Statement Pt is about 14.5  wks s/p L shoulder arthroscopy.  She had an MRI and had an MD appt this week for results.  MRI showed :  Worsened atrophy of the infraspinatus due to a tear at the  musculotendinous junction. The rotator cuff is otherwise intact with  tendinosis/postoperative change at the tendon insertion noted.  Pt reports she will not have to have surgery but will need EMG for futher evaluation to determine why she has limitations in movement at the shoulder.  Pain in hand decreased to none today, tolerating ionto well.  Continues to work on ROM, edema control and improving functional use of LUE.  Added fluidotherapy today so patient could move while in heat and also to help with desensitization of the arm, tolerated well.    OT Occupational Profile and History Problem Focused Assessment - Including review of records relating to presenting problem    Occupational performance deficits (Please refer to evaluation for details): ADL's;IADL's;Rest and Sleep;Work;Play;Leisure;Social Participation    Body Structure / Function / Physical Skills  ADL;Coordination;IADL;Pain;Strength;Edema;Dexterity;FMC;Flexibility;ROM;UE functional use    Rehab Potential Good    Clinical Decision Making Several treatment options, min-mod task modification necessary    Comorbidities Affecting Occupational Performance: None    Modification or Assistance to Complete Evaluation  Min-Moderate modification of tasks or assist with assess necessary to complete eval    OT Frequency 2x / week    OT Treatment/Interventions Self-care/ADL training;Contrast Bath;Therapeutic exercise;Manual lymph drainage;Patient/family education;Splinting;Compression bandaging;Manual Therapy;Passive range of motion    Consulted and Agree with Plan of Care Patient           Patient will benefit from skilled therapeutic intervention in order to improve the following deficits and impairments:   Body Structure / Function / Physical Skills: ADL,Coordination,IADL,Pain,Strength,Edema,Dexterity,FMC,Flexibility,ROM,UE functional use       Visit Diagnosis: Stiffness of left hand, not elsewhere classified  Muscle weakness (generalized)  Stiffness of left wrist, not elsewhere classified  Localized edema    Problem List There are no problems to display for this patient.  Jillisa Harris Oneita Jolly, OTR/L, CLT  Latavion Halls 03/19/2021, 8:28 PM  [place] [address], [place], [address] Phone: [phone]   Fax:  [phone]  Name: Teresa Rodriguez MRN: [id number] Date of Birth: September 26, 1971

## 2021-03-22 ENCOUNTER — Other Ambulatory Visit: Payer: Self-pay

## 2021-03-22 ENCOUNTER — Ambulatory Visit: Payer: Managed Care, Other (non HMO) | Admitting: Occupational Therapy

## 2021-03-22 DIAGNOSIS — M6281 Muscle weakness (generalized): Secondary | ICD-10-CM

## 2021-03-22 DIAGNOSIS — M25642 Stiffness of left hand, not elsewhere classified: Secondary | ICD-10-CM

## 2021-03-22 DIAGNOSIS — M25632 Stiffness of left wrist, not elsewhere classified: Secondary | ICD-10-CM | POA: Diagnosis not present

## 2021-03-22 DIAGNOSIS — R600 Localized edema: Secondary | ICD-10-CM

## 2021-03-22 NOTE — Therapy (Signed)
[place] [facility] [address] [place], [address] Phone: [phone]   Fax:  [phone]  Occupational Therapy Treatment  Patient Details  Name: Teresa Rodriguez MRN: [id number] Date of Birth: [birth year] Referring Provider (OT): Dr Posey Pronto   Encounter Date: 03/22/2021   OT End of Session - 03/22/21 1455    Visit Number 9    Number of Visits 14    Date for OT Re-Evaluation 04/26/21    OT Start Time 1415    OT Stop Time 1509    OT Time Calculation (min) 54 min    Activity Tolerance Patient tolerated treatment well    Behavior During Therapy [facility] for tasks assessed/performed           Past Medical History:  Diagnosis Date  . COVID-19 06/2020  . GERD (gastroesophageal reflux disease)   . Headache   . Hypertriglyceridemia   . Seasonal allergies     Past Surgical History:  Procedure Laterality Date  . OVARIAN CYST REMOVAL Right   . SHOULDER ARTHROSCOPY WITH ROTATOR CUFF REPAIR AND SUBACROMIAL DECOMPRESSION Left 12/07/2020   Procedure: Left shoulder mini-open rotator cuff repair, arthroscopic biceps tenodesis, and subacromial decompression;  Surgeon: Leim Fabry, MD;  Location: [facility];  Service: Orthopedics;  Laterality: Left;  Reche Dixon to Assist    There were no vitals filed for this visit.   Subjective Assessment - 03/22/21 1454    Subjective  Pt reports she went to the MD for MRI results, she does have a small tear but MD doesn't feel she needs surgery, referring her for EMG study based on her symptoms next week    Pertinent History DR Posey Pronto did left shoulder arthroscopy 12/07/20     12/07/20:  PROCEDURES:   1. Left mini open rotator cuff repair  2. Left arthroscopic biceps tenodesis  3. Left subacromial decompression  4. Left extensive debridement of shoulder (glenohumeral and subacromial spaces)    History of Present Illness:  02/02/2021:  She was on 02/01/21 approximately 8 weeks after undergoing  above procedures. She returns to the office earlier than anticipated due to persistent left hand and forearm swelling and difficulty using her fingers. She also notes occasional color changes if her hand is in a dependent position for prolonged period of time. She also notes that the left hand feels warmer than the right hand frequently. She does not have exquisite pain in her hands although there are certain areas that are more tender than what she would expect - refer to hand therapy - ? CRPS    Patient Stated Goals Want to get the swelling and heavy feeling better so I can use my hand    Currently in Pain? Yes    Pain Score 2     Pain Location Shoulder    Pain Orientation Left    Pain Descriptors / Indicators Aching               LYMPHEDEMA/ONCOLOGY QUESTIONNAIRE - 03/22/21 0001      Left Upper Extremity Lymphedema   15 cm Proximal to Olecranon Process 28 cm    10 cm Proximal to Olecranon Process 26.5 cm    Olecranon Process 25.8 cm    15 cm Proximal to Ulnar Styloid Process 24.5 cm    10 cm Proximal to Ulnar Styloid Process 22 cm    Just Proximal to Ulnar Styloid Process 17.6 cm    Across Hand at [employer] 18.2  cm    At [facility] of 2nd Digit 6.8 cm    At [facility] of Thumb 6.2 cm            Measurements taken of L UE -greatprogress indecongestion of L UE-  L forearm increase since last time - but pt did not wear any compression yesterday going out and it was 80 degrees  And did not had size D tubigrip last week to provide Pain over 1st dorsal compartment and wrist improve - cont with some stiffness in wristflexion , extentionandcomposite flexion digits -and thumbRA because of edema Pt pain over distal radius improved as well as  Wynn Maudlin test on L - DId get  Friday the 8th April verbal order from Dr Posey Pronto for ionto with dexamethazone use for 1st dorsal compartment   AAROM for wrist flexion , extention over armrest and RD, UD AROMat side 10reps Sup and  pronation with elbow supported on armrest- 10 reps And add  This date 1 lbs weight for wrist and forearm - 10 reps - with elbow supported or forearm on armrest- 12 reps -and increase in 48 hrs to 2nd set pain free   Cont tendon glides and thumb PA and RA- opposition Cont some isometric for thumb in all planes - 12 reps  And putty light blue for gripping ony 12 reps 2 x day  Pain free     Skin check done prior - no issues - tolerate well- pt to keep on for hour afterwards          OT Treatments/Exercises (OP) - 03/22/21 0001      Iontophoresis   Type of Iontophoresis Dexamethasone    Location L 1 st dorsal compartment    Dose med patch, 2.0 current,    Time 19                  OT Education - 03/22/21 1455    Education Details progress and changes to HEP; ionto use    Person(s) Educated Patient    Methods Explanation;Demonstration;Tactile cues;Verbal cues;Handout    Comprehension Verbal cues required;Returned demonstration;Verbalized understanding            OT Short Term Goals - 03/22/21 1459      OT SHORT TERM GOAL #1   Title Pt to be independent in MLD and wearing of compression to decrease L UE circumference by 1 cm in wrist and forearm and 0.5 cm in upperarm and hand to show increase digits and wrist AROM    Baseline Circumference increase in L  hand and digits by 0.6 cm, wrist and forearm 2.2cm, elbow 0.9 cm and upper arm 0.4 cm - AROM in digits and wrist decrease see flowsheet  -NOW decrease in L UE to same as R UE - increase AROM in wrist and digits    Status Achieved             OT Long Term Goals - 03/22/21 1501      OT LONG TERM GOAL #1   Title Pt edema in L UE decrease to less than 0.5 from digits to upper arm to be able to use hand in gripping ADL's objects, utencils and increase ease  on computer    Baseline increase from digits to upperarm - compare to R UE - see STG - not able to hold or girp objects - one finger at time on computer   NOW edema decrease to same as R -and AROM WFL in wrist and digits -  but limited by shoulder AROM    Status Achieved      OT LONG TERM GOAL #2   Title L digits and wrist AROM increase to WNL to grip toys to  play with granddaugther ,  grip objects in kitchen at waist height    Baseline unable to use L hand to play or use at home- stiffness in wrist and digits - Wrist ext 35, flexion 50 , UD 25, RD 15 - MC 60-80's and PIP's 90-95's  NOW edema decrease to same as R and wrist and digits WFL and pain - but limited in use by shoulder AROM    Status Achieved      OT LONG TERM GOAL #3   Title Initiate grip strength and prehension in L hand    Baseline Only edema control and AROM at this time in L hand - NT - because of shoulder MRI and EMG scheduled    Time 2    Period Weeks    Status On-going    Target Date 04/05/21      OT LONG TERM GOAL #4   Title L elbow AROM increase to WNL to use on computer and use with bathing and dressing    Baseline feel pull  -cannot keep all the way extended the L elbow, supination decrease - cannot use on computer - only one finger at time- cannot use for bathing and dressing little - NOW elbow AROM WNL - but not able to use because of shoulder    Status Achieved      OT LONG TERM GOAL #5   Title L wrist and thumb strength increase to more than 4+/5 to grip objects during ADL's    Baseline decrease strength and stiffness- 4-/5 for wrist and thumb    Time 5    Period Weeks    Status New    Target Date 04/26/21                 Plan - 03/22/21 1456    Clinical Impression Statement Pt is about 15  wks s/p L shoulder arthroscopy.  She had an MRI and had an MD appt last week for results.  MRI showed :  Worsened atrophy of the infraspinatus due to a tear at the  musculotendinous junction. The rotator cuff is otherwise intact with  tendinosis/postoperative change at the tendon insertion noted.  Pt reports she will not have to have surgery but will need EMGnext  week  for futher evaluation to determine why she has limitations in movement at the shoulder.  Pain in 1st dorsal compartment with wrist and thumb ROM  decreased to none today at rest or ROM - still tenderness. Increase L forearm circumference - but did not wear sleeve yesterday and was out and about -and it was 80 degrees. Did add some 1 lbs weight for wrist in all planes and to support elbow on armrest if needed.  Continues to decrease pain , increase ROM and strength distally, edema control and improving functional use of LUE.    OT Occupational Profile and History Problem Focused Assessment - Including review of records relating to presenting problem    Occupational performance deficits (Please refer to evaluation for details): ADL's;IADL's;Rest and Sleep;Work;Play;Leisure;Social Participation    Body Structure / Function / Physical Skills ADL;Coordination;IADL;Pain;Strength;Edema;Dexterity;FMC;Flexibility;ROM;UE functional use    Rehab Potential Good    Clinical Decision Making Several treatment options, min-mod task modification necessary    Comorbidities Affecting Occupational Performance: None  Modification or Assistance to Complete Evaluation  Min-Moderate modification of tasks or assist with assess necessary to complete eval    OT Frequency 2x / week   decrease to 1 x wk if indicated   OT Duration --   5 wks   OT Treatment/Interventions Self-care/ADL training;Contrast Bath;Therapeutic exercise;Manual lymph drainage;Patient/family education;Splinting;Compression bandaging;Manual Therapy;Passive range of motion    Consulted and Agree with Plan of Care Patient           Patient will benefit from skilled therapeutic intervention in order to improve the following deficits and impairments:   Body Structure / Function / Physical Skills: ADL,Coordination,IADL,Pain,Strength,Edema,Dexterity,FMC,Flexibility,ROM,UE functional use       Visit Diagnosis: Stiffness of left hand, not elsewhere  classified - Plan: Ot plan of care cert/re-cert  Muscle weakness (generalized) - Plan: Ot plan of care cert/re-cert  Stiffness of left wrist, not elsewhere classified - Plan: Ot plan of care cert/re-cert  Localized edema - Plan: Ot plan of care cert/re-cert    Problem List There are no problems to display for this patient.   Rosalyn Gess OTR/L,CLT 03/22/2021, 3:08 PM  [place] [facility] [address], [place], [address] Phone: [phone]   Fax:  [phone]  Name: Teresa Rodriguez MRN: [id number] Date of Birth: Sep 23, 1971

## 2021-03-25 ENCOUNTER — Other Ambulatory Visit: Payer: Self-pay

## 2021-03-25 ENCOUNTER — Ambulatory Visit: Payer: Managed Care, Other (non HMO) | Admitting: Occupational Therapy

## 2021-03-25 DIAGNOSIS — R600 Localized edema: Secondary | ICD-10-CM

## 2021-03-25 DIAGNOSIS — M25632 Stiffness of left wrist, not elsewhere classified: Secondary | ICD-10-CM

## 2021-03-25 DIAGNOSIS — M6281 Muscle weakness (generalized): Secondary | ICD-10-CM

## 2021-03-25 DIAGNOSIS — M25642 Stiffness of left hand, not elsewhere classified: Secondary | ICD-10-CM

## 2021-03-25 NOTE — Therapy (Signed)
[place] [facility] [address] [place], [address] Phone: [phone]   Fax:  [phone]  Occupational Therapy Treatment  Patient Details  Name: Teresa Rodriguez MRN: [id number] Date of Birth: [birth year] Referring Provider (OT): Dr Posey Pronto   Encounter Date: 03/25/2021   OT End of Session - 03/25/21 1450    Visit Number 10    Number of Visits 14    Date for OT Re-Evaluation 04/26/21    OT Start Time 1440    OT Stop Time 1528    OT Time Calculation (min) 48 min    Activity Tolerance Patient tolerated treatment well    Behavior During Therapy [facility] for tasks assessed/performed           Past Medical History:  Diagnosis Date  . COVID-19 06/2020  . GERD (gastroesophageal reflux disease)   . Headache   . Hypertriglyceridemia   . Seasonal allergies     Past Surgical History:  Procedure Laterality Date  . OVARIAN CYST REMOVAL Right   . SHOULDER ARTHROSCOPY WITH ROTATOR CUFF REPAIR AND SUBACROMIAL DECOMPRESSION Left 12/07/2020   Procedure: Left shoulder mini-open rotator cuff repair, arthroscopic biceps tenodesis, and subacromial decompression;  Surgeon: Leim Fabry, MD;  Location: [place];  Service: Orthopedics;  Laterality: Left;  Reche Dixon to Assist    There were no vitals filed for this visit.   Subjective Assessment - 03/25/21 1517    Subjective  Done okay with the putty and 1 lbs weight- did not increase reps or sets - did do them today - waiting for the EMG next week - think pain in wrist is better- and thumb getting little stronger    Pertinent History DR Posey Pronto did left shoulder arthroscopy 12/07/20     12/07/20:  PROCEDURES:   1. Left mini open rotator cuff repair  2. Left arthroscopic biceps tenodesis  3. Left subacromial decompression  4. Left extensive debridement of shoulder (glenohumeral and subacromial spaces)    History of Present Illness:  02/02/2021:  She was on 02/01/21 approximately 8  weeks after undergoing above procedures. She returns to the office earlier than anticipated due to persistent left hand and forearm swelling and difficulty using her fingers. She also notes occasional color changes if her hand is in a dependent position for prolonged period of time. She also notes that the left hand feels warmer than the right hand frequently. She does not have exquisite pain in her hands although there are certain areas that are more tender than what she would expect - refer to hand therapy - ? CRPS    Patient Stated Goals Want to get the swelling and heavy feeling better so I can use my hand    Currently in Pain? Yes    Pain Score 2     Pain Location Wrist    Pain Orientation Left    Pain Descriptors / Indicators Tender    Pain Type Acute pain    Aggravating Factors  Tenderness over 1st dorsal compartment               LYMPHEDEMA/ONCOLOGY QUESTIONNAIRE - 03/25/21 0001      Left Upper Extremity Lymphedema   10 cm Proximal to Olecranon Process 26.4 cm    Olecranon Process 25.5 cm    15 cm Proximal to Ulnar Styloid Process 24.3 cm    10 cm Proximal to Ulnar Styloid Process 21.6 cm    Just Proximal to Ulnar Styloid Process  17.4 cm    Across Hand at [employer] 18.8 cm          scar massage done to shoulder scars - use xtractor and mini massager- tolerate well -and on lateral upper arm - for tightness over fibrosis - responded great   Measurements taken of L UE -greatprogress indecongestion of L UE since SOC - cont to wear compression tubigrip D and E on L UE and isotoner glove  Pain over 1st dorsal compartment and wrist improved - cont with some stiffness in wristflexion , extentionandcomposite flexiondigits- Pt pain over distal radius improved as well as  Lourena SimmondsFinkelstein test on L - DId get  Friday the 8th April verbal order from Dr Allena KatzPatel for ionto with dexamethazone use for 1st dorsal compartment   AAROM for wrist flexion , extention over armrest  10reps Sup and pronation with elbow supported on armrest- 10 reps Cont with 1 lbs weight for wrist and forearm - 10 reps - with elbow supported or forearm on armrest- 12 reps -can increase to 2nd set over weekend if pain free  Cont tendon glides and thumb PA and RA- opposition Cont some isometric for thumb in all planes - 12 reps  And putty light blue for gripping  12 reps - increase to 2-3 sets of pain free over weekend 2 x day Pain free    Skin check done prior - no issues - tolerate well- pt to keep on for hour afterwards          OT Treatments/Exercises (OP) - 03/25/21 0001      Iontophoresis   Type of Iontophoresis Dexamethasone    Location L 1 st dorsal compartment    Dose med patch, 2.0 current,    Time 19                  OT Education - 03/25/21 1516    Education Details progress and changes to HEP; ionto use    Person(s) Educated Patient    Methods Explanation;Demonstration;Tactile cues;Verbal cues;Handout    Comprehension Verbal cues required;Returned demonstration;Verbalized understanding            OT Short Term Goals - 03/22/21 1459      OT SHORT TERM GOAL #1   Title Pt to be independent in MLD and wearing of compression to decrease L UE circumference by 1 cm in wrist and forearm and 0.5 cm in upperarm and hand to show increase digits and wrist AROM    Baseline Circumference increase in L  hand and digits by 0.6 cm, wrist and forearm 2.2cm, elbow 0.9 cm and upper arm 0.4 cm - AROM in digits and wrist decrease see flowsheet  -NOW decrease in L UE to same as R UE - increase AROM in wrist and digits    Status Achieved             OT Long Term Goals - 03/22/21 1501      OT LONG TERM GOAL #1   Title Pt edema in L UE decrease to less than 0.5 from digits to upper arm to be able to use hand in gripping ADL's objects, utencils and increase ease  on computer    Baseline increase from digits to upperarm - compare to R UE - see STG - not  able to hold or girp objects - one finger at time on computer  NOW edema decrease to same as R -and AROM WFL in wrist and digits - but limited by  shoulder AROM    Status Achieved      OT LONG TERM GOAL #2   Title L digits and wrist AROM increase to WNL to grip toys to  play with granddaugther ,  grip objects in kitchen at waist height    Baseline unable to use L hand to play or use at home- stiffness in wrist and digits - Wrist ext 35, flexion 50 , UD 25, RD 15 - MC 60-80's and PIP's 90-95's  NOW edema decrease to same as R and wrist and digits WFL and pain - but limited in use by shoulder AROM    Status Achieved      OT LONG TERM GOAL #3   Title Initiate grip strength and prehension in L hand    Baseline Only edema control and AROM at this time in L hand - NT - because of shoulder MRI and EMG scheduled    Time 2    Period Weeks    Status On-going    Target Date 04/05/21      OT LONG TERM GOAL #4   Title L elbow AROM increase to WNL to use on computer and use with bathing and dressing    Baseline feel pull  -cannot keep all the way extended the L elbow, supination decrease - cannot use on computer - only one finger at time- cannot use for bathing and dressing little - NOW elbow AROM WNL - but not able to use because of shoulder    Status Achieved      OT LONG TERM GOAL #5   Title L wrist and thumb strength increase to more than 4+/5 to grip objects during ADL's    Baseline decrease strength and stiffness- 4-/5 for wrist and thumb    Time 5    Period Weeks    Status New    Target Date 04/26/21                 Plan - 03/25/21 1520    Clinical Impression Statement Pt is about 15 1/2 wks s/p R L shoulder arthroscopy - pt made great progress in decreasing of circumference in L UE - compare to eval - cont to wear isotoner glove, tubigrip D hand to elbow and E mid forearm to upper arm  during day and night time - fibrosis improved greatly - with some tightness still at times on  lateral upper arm - but reponded great on softissue - distal AROM to elbow , wrist and digits increase greatly - still some stiffness in wrist ext, flexion but initiated this date 1 lbs weight to wrist in all planes and light putty for gripping - done this date 6th session of ionto to 1st dorsal compartments- pt awaiting EMG to L UE - MRI showwd tear at musculotendinous junction - but will not have surgery per pt.    OT Occupational Profile and History Problem Focused Assessment - Including review of records relating to presenting problem    Occupational performance deficits (Please refer to evaluation for details): ADL's;IADL's;Rest and Sleep;Work;Play;Leisure;Social Participation    Body Structure / Function / Physical Skills ADL;Coordination;IADL;Pain;Strength;Edema;Dexterity;FMC;Flexibility;ROM;UE functional use    Rehab Potential Good    Clinical Decision Making Several treatment options, min-mod task modification necessary    Comorbidities Affecting Occupational Performance: None    Modification or Assistance to Complete Evaluation  Min-Moderate modification of tasks or assist with assess necessary to complete eval    OT Frequency 2x / week  OT Duration 4 weeks    OT Treatment/Interventions Self-care/ADL training;Contrast Bath;Therapeutic exercise;Manual lymph drainage;Patient/family education;Splinting;Compression bandaging;Manual Therapy;Passive range of motion    Consulted and Agree with Plan of Care Patient           Patient will benefit from skilled therapeutic intervention in order to improve the following deficits and impairments:   Body Structure / Function / Physical Skills: ADL,Coordination,IADL,Pain,Strength,Edema,Dexterity,FMC,Flexibility,ROM,UE functional use       Visit Diagnosis: Stiffness of left hand, not elsewhere classified  Muscle weakness (generalized)  Stiffness of left wrist, not elsewhere classified  Localized edema    Problem List There are no  problems to display for this patient.   Rosalyn Gess OTR/L,CLT 03/25/2021, 3:30 PM  [place] [facility] [address] [place], [address] Phone: [phone]   Fax:  [phone]  Name: ROSARIA KUBIN MRN: [id number] Date of Birth: 1971/02/28

## 2021-03-30 ENCOUNTER — Ambulatory Visit: Payer: Managed Care, Other (non HMO) | Admitting: Occupational Therapy

## 2021-04-01 ENCOUNTER — Ambulatory Visit: Payer: Managed Care, Other (non HMO) | Admitting: Occupational Therapy

## 2021-04-06 ENCOUNTER — Other Ambulatory Visit: Payer: Self-pay

## 2021-04-06 ENCOUNTER — Ambulatory Visit: Payer: Managed Care, Other (non HMO) | Attending: Orthopedic Surgery | Admitting: Occupational Therapy

## 2021-04-06 DIAGNOSIS — M6281 Muscle weakness (generalized): Secondary | ICD-10-CM | POA: Diagnosis present

## 2021-04-06 DIAGNOSIS — M25642 Stiffness of left hand, not elsewhere classified: Secondary | ICD-10-CM | POA: Insufficient documentation

## 2021-04-06 DIAGNOSIS — M25632 Stiffness of left wrist, not elsewhere classified: Secondary | ICD-10-CM

## 2021-04-06 DIAGNOSIS — R600 Localized edema: Secondary | ICD-10-CM

## 2021-04-06 NOTE — Therapy (Signed)
[facility] [address] [place], [address] Phone: [phone]   Fax:  [phone]  Occupational Therapy Treatment  Patient Details  Name: LIL LEPAGE MRN: [id number] Date of Birth: 12/25/1970 Referring Provider (OT): Dr Posey Pronto   Encounter Date: 04/06/2021   OT End of Session - 04/06/21 1446    Visit Number 11    Number of Visits 14    Date for OT Re-Evaluation 04/26/21    OT Start Time 1345    OT Stop Time 1428    OT Time Calculation (min) 43 min           Past Medical History:  Diagnosis Date  . COVID-19 06/2020  . GERD (gastroesophageal reflux disease)   . Headache   . Hypertriglyceridemia   . Seasonal allergies     Past Surgical History:  Procedure Laterality Date  . OVARIAN CYST REMOVAL Right   . SHOULDER ARTHROSCOPY WITH ROTATOR CUFF REPAIR AND SUBACROMIAL DECOMPRESSION Left 12/07/2020   Procedure: Left shoulder mini-open rotator cuff repair, arthroscopic biceps tenodesis, and subacromial decompression;  Surgeon: Leim Fabry, MD;  Location: [facility];  Service: Orthopedics;  Laterality: Left;  Reche Dixon to Assist    There were no vitals filed for this visit.   Subjective Assessment - 04/06/21 1444    Subjective  Doing better- can do the 1lbs weight for my wrist 2 sets and putty too- putty getting easier - was able to use nailclipper - and my hand and arm not as heavy - swelling doing better- not wear compression in hte morning for few hours - did not had any pain at wrist or base of thumb - can move my shoulder little better    Pertinent History DR Posey Pronto did left shoulder arthroscopy 12/07/20     12/07/20:  PROCEDURES:   1. Left mini open rotator cuff repair  2. Left arthroscopic biceps tenodesis  3. Left subacromial decompression  4. Left extensive debridement of shoulder (glenohumeral and subacromial spaces)    History of Present Illness:  02/02/2021:  She was on 02/01/21  approximately 8 weeks after undergoing above procedures. She returns to the office earlier than anticipated due to persistent left hand and forearm swelling and difficulty using her fingers. She also notes occasional color changes if her hand is in a dependent position for prolonged period of time. She also notes that the left hand feels warmer than the right hand frequently. She does not have exquisite pain in her hands although there are certain areas that are more tender than what she would expect - refer to hand therapy - ? CRPS    Patient Stated Goals Want to get the swelling and heavy feeling better so I can use my hand    Currently in Pain? No/denies              [facility] OT Assessment - 04/06/21 0001      Strength   Right Hand Grip (lbs) 50    Right Hand Lateral Pinch 13 lbs    Right Hand 3 Point Pinch 12 lbs    Left Hand Grip (lbs) 18    Left Hand Lateral Pinch 6 lbs    Left Hand 3 Point Pinch 3 lbs           LYMPHEDEMA/ONCOLOGY QUESTIONNAIRE - 04/06/21 0001      Left Upper Extremity Lymphedema   15 cm Proximal to Olecranon Process 28 cm    10  cm Proximal to Olecranon Process 26.7 cm    Olecranon Process 26 cm    15 cm Proximal to Ulnar Styloid Process 24.5 cm    10 cm Proximal to Ulnar Styloid Process 21.5 cm    Just Proximal to Ulnar Styloid Process 17 cm    Across Hand at [employer] 18.5 cm             Measurements taken of L UE circumference -greatprogress indecongestion of L UE since [facility] -  About same and only increase in forearm compare to R UE  cont to wear compression tubigrip D and E on L UE and isotoner glove   Pt report did not had any pain 1st dorsal compartment and wrist since last time - cont with some stiffness in wristflexion , extentionandcomposite flexiondigits-but improving   1 lbs weight for sup and pronation - can support elbow at first and then gradually do unsupported- 1 x 12 reps for 3 days , then increase 2nd and then 3rd set  pain free 2 lbs for wrist RD, UD , and then wrist flexion, ext horizontal 12 reps - 1 x day same upgrade as sup/pro  Cont tendon glides and thumb PA and RA- opposition Contsome isometric for thumb in all planes - 12 reps  And putty upgrade to teal and cont  gripping  2 x  12 reps  And did add and review lat and 3 point grip - 12 reps - 1 x day - then increase 2nd and 3rd set over the next 3-6 days   Pain free              OT Education - 04/06/21 1446    Education Details progress and changes to HEP; ionto use    Person(s) Educated Patient    Methods Explanation;Demonstration;Tactile cues;Verbal cues;Handout    Comprehension Verbal cues required;Returned demonstration;Verbalized understanding            OT Short Term Goals - 03/22/21 1459      OT SHORT TERM GOAL #1   Title Pt to be independent in MLD and wearing of compression to decrease L UE circumference by 1 cm in wrist and forearm and 0.5 cm in upperarm and hand to show increase digits and wrist AROM    Baseline Circumference increase in L  hand and digits by 0.6 cm, wrist and forearm 2.2cm, elbow 0.9 cm and upper arm 0.4 cm - AROM in digits and wrist decrease see flowsheet  -NOW decrease in L UE to same as R UE - increase AROM in wrist and digits    Status Achieved             OT Long Term Goals - 03/22/21 1501      OT LONG TERM GOAL #1   Title Pt edema in L UE decrease to less than 0.5 from digits to upper arm to be able to use hand in gripping ADL's objects, utencils and increase ease  on computer    Baseline increase from digits to upperarm - compare to R UE - see STG - not able to hold or girp objects - one finger at time on computer  NOW edema decrease to same as R -and AROM WFL in wrist and digits - but limited by shoulder AROM    Status Achieved      OT LONG TERM GOAL #2   Title L digits and wrist AROM increase to WNL to grip toys to  play with  granddaugther ,  grip objects in kitchen at waist  height    Baseline unable to use L hand to play or use at home- stiffness in wrist and digits - Wrist ext 35, flexion 50 , UD 25, RD 15 - MC 60-80's and PIP's 90-95's  NOW edema decrease to same as R and wrist and digits WFL and pain - but limited in use by shoulder AROM    Status Achieved      OT LONG TERM GOAL #3   Title Initiate grip strength and prehension in L hand    Baseline Only edema control and AROM at this time in L hand - NT - because of shoulder MRI and EMG scheduled    Time 2    Period Weeks    Status On-going    Target Date 04/05/21      OT LONG TERM GOAL #4   Title L elbow AROM increase to WNL to use on computer and use with bathing and dressing    Baseline feel pull  -cannot keep all the way extended the L elbow, supination decrease - cannot use on computer - only one finger at time- cannot use for bathing and dressing little - NOW elbow AROM WNL - but not able to use because of shoulder    Status Achieved      OT LONG TERM GOAL #5   Title L wrist and thumb strength increase to more than 4+/5 to grip objects during ADL's    Baseline decrease strength and stiffness- 4-/5 for wrist and thumb    Time 5    Period Weeks    Status New    Target Date 04/26/21                 Plan - 04/06/21 1447    Clinical Impression Statement Pt is about about 17 wks s/p R L shoulder arthroscopy - Pt was seen about 2 wks ago -and made great progress in strength in forearm and grip - wrist ext, flex,RD,UD 4+/5 , sup/pro 4-/5 , pt using forearm while talking and holding objects - shoulders more even and looked more positive this date. Grip R 50lbs, L 18 lbs , Lat and 3 point decrease morethan 50% too - no pain over 1st dorsal compartment with 1 lbs weight and putty- upgrade to 2 lbs for some forearm HEP , and putty upgrade to medium - pt to keep exercises pain free- pt report Nerve conduction showed normal nerves - Per pt show did some of her old shoulder HEP at home and forearm stronger  now, and arm not as heavy - Shoulder flexion and ABD 45 this date and shoulder ext 35 - L UE circumference staying steady -and only increase in forearm about 1 cm compare to R UE -  cont to wear isotoner glove, tubigrip D hand to elbow and E mid forearm to upper arm  during day and night time -  off some in the morning - Pt to cont with HEP for 2wks and return to OT -  cont to strengthening distal L UE elbow to hand , while maintaining circumference in LUE    OT Occupational Profile and History Problem Focused Assessment - Including review of records relating to presenting problem    Occupational performance deficits (Please refer to evaluation for details): ADL's;IADL's;Rest and Sleep;Work;Play;Leisure;Social Participation    Body Structure / Function / Physical Skills ADL;Coordination;IADL;Pain;Strength;Edema;Dexterity;FMC;Flexibility;ROM;UE functional use    Rehab Potential Good    Clinical  Decision Making Several treatment options, min-mod task modification necessary    Comorbidities Affecting Occupational Performance: None    Modification or Assistance to Complete Evaluation  Min-Moderate modification of tasks or assist with assess necessary to complete eval    OT Frequency Biweekly    OT Duration 4 weeks    OT Treatment/Interventions Self-care/ADL training;Contrast Bath;Therapeutic exercise;Manual lymph drainage;Patient/family education;Splinting;Compression bandaging;Manual Therapy;Passive range of motion    Consulted and Agree with Plan of Care Patient           Patient will benefit from skilled therapeutic intervention in order to improve the following deficits and impairments:   Body Structure / Function / Physical Skills: ADL,Coordination,IADL,Pain,Strength,Edema,Dexterity,FMC,Flexibility,ROM,UE functional use       Visit Diagnosis: Stiffness of left hand, not elsewhere classified  Muscle weakness (generalized)  Stiffness of left wrist, not elsewhere classified  Localized  edema    Problem List There are no problems to display for this patient.   Rosalyn Gess OTR/L,CLT 04/06/2021, 2:55 PM  [facility] [address], [place], [address] Phone: [phone]   Fax:  [phone]  Name: MARAN SATO MRN: [id number] Date of Birth: 07-Mar-1971

## 2021-04-19 ENCOUNTER — Ambulatory Visit: Payer: Managed Care, Other (non HMO) | Admitting: Occupational Therapy

## 2021-04-19 ENCOUNTER — Other Ambulatory Visit: Payer: Self-pay

## 2021-04-19 DIAGNOSIS — M25642 Stiffness of left hand, not elsewhere classified: Secondary | ICD-10-CM

## 2021-04-19 DIAGNOSIS — M6281 Muscle weakness (generalized): Secondary | ICD-10-CM

## 2021-04-19 DIAGNOSIS — R600 Localized edema: Secondary | ICD-10-CM

## 2021-04-19 DIAGNOSIS — M25632 Stiffness of left wrist, not elsewhere classified: Secondary | ICD-10-CM

## 2021-04-19 NOTE — Therapy (Signed)
[facility] [address], [place], [address] Phone: [phone]   Fax:  [phone]  Occupational Therapy Treatment  Patient Details  Name: Teresa Rodriguez MRN: [id number] Date of Birth: 01-04-1971 Referring Provider (OT): Dr Posey Pronto   Encounter Date: 04/19/2021   OT End of Session - 04/19/21 1427    Visit Number 12    Number of Visits 14    Date for OT Re-Evaluation 04/26/21    OT Start Time 1428    OT Stop Time 1509    OT Time Calculation (min) 41 min    Activity Tolerance Patient tolerated treatment well    Behavior During Therapy [facility] for tasks assessed/performed           Past Medical History:  Diagnosis Date  . COVID-19 06/2020  . GERD (gastroesophageal reflux disease)   . Headache   . Hypertriglyceridemia   . Seasonal allergies     Past Surgical History:  Procedure Laterality Date  . OVARIAN CYST REMOVAL Right   . SHOULDER ARTHROSCOPY WITH ROTATOR CUFF REPAIR AND SUBACROMIAL DECOMPRESSION Left 12/07/2020   Procedure: Left shoulder mini-open rotator cuff repair, arthroscopic biceps tenodesis, and subacromial decompression;  Surgeon: Leim Fabry, MD;  Location: [place] [facility];  Service: Orthopedics;  Laterality: Left;  Reche Dixon to Assist    There were no vitals filed for this visit.   Subjective Assessment - 04/19/21 1427    Subjective  Dr Posey Pronto called me and waiting to hear back from him - looking at somehting else on my MRI - but I am doing your exercises but could not do the 2 lbs - putty doing okay - trying to use it more- going some during day without the upper and forearm compression    Pertinent History DR Posey Pronto did left shoulder arthroscopy 12/07/20     12/07/20:  PROCEDURES:   1. Left mini open rotator cuff repair  2. Left arthroscopic biceps tenodesis  3. Left subacromial decompression  4. Left extensive debridement of shoulder (glenohumeral and subacromial spaces)    History of  Present Illness:  02/02/2021:  She was on 02/01/21 approximately 8 weeks after undergoing above procedures. She returns to the office earlier than anticipated due to persistent left hand and forearm swelling and difficulty using her fingers. She also notes occasional color changes if her hand is in a dependent position for prolonged period of time. She also notes that the left hand feels warmer than the right hand frequently. She does not have exquisite pain in her hands although there are certain areas that are more tender than what she would expect - refer to hand therapy - ? CRPS    Patient Stated Goals Want to get the swelling and heavy feeling better so I can use my hand    Currently in Pain? No/denies              [facility] OT Assessment - 04/19/21 0001      Strength   Right Hand Grip (lbs) 50    Right Hand Lateral Pinch 13 lbs    Right Hand 3 Point Pinch 12 lbs    Left Hand Grip (lbs) 21    Left Hand Lateral Pinch 7 lbs    Left Hand 3 Point Pinch 5 lbs           LYMPHEDEMA/ONCOLOGY QUESTIONNAIRE - 04/19/21 0001      Right Upper Extremity Lymphedema   15 cm Proximal to Olecranon  Process 30 cm    10 cm Proximal to Olecranon Process 27 cm    Olecranon Process 26.6 cm    15 cm Proximal to Ulnar Styloid Process 26 cm    10 cm Proximal to Ulnar Styloid Process 22.5 cm    Just Proximal to Ulnar Styloid Process 17 cm    Across Hand at [employer] 19.4 cm      Left Upper Extremity Lymphedema   15 cm Proximal to Olecranon Process 28.4 cm    10 cm Proximal to Olecranon Process 27 cm    Olecranon Process 26 cm    15 cm Proximal to Ulnar Styloid Process 24.5 cm    10 cm Proximal to Ulnar Styloid Process 21.8 cm    Just Proximal to Ulnar Styloid Process 18 cm    Across Hand at [employer] 19 cm            Measurements taken of L UE circumference -greatprogress indecongestion of L UEsince SOC -  About same   cont to wear compression tubigrip D and E on L UE and isotoner  glove- but can wean at times out of proximal tubigip  Pt report did not had any pain 1st dorsal compartment  But some at the wrist with 2 lbs weight  - cont with some stiffness in wristflexion , extentionandcomposite flexiondigits-but improving   upgrade to 2 lbs weight for sup and pronation/ UD and RD- but Benik neoprene on and no pain - can do 12 reps  - can support elbow at first and then gradually do unsupported- 1 x 12 reps for 3 days , then increase 2nd and then 3rd set pain free 2 lbs for wrist  wrist flexion, ext horizontal 12 reps - 1 x day - 12 reps  Increase over the next 2 wks to 2nd and 3rd set pain free  Cont tendon glides and thumb PA and RA- opposition Contsome isometric for thumb in all planes - 12 reps  And putty upgrade to med green l and cont  gripping  2 x 12 reps  And  lat and 3 point grip - 12 reps - 1 x day - then increase 2nd and 3rd set over the next 2 wks  Keep pain free - needed min A for lat grip -did not do that one correctly   Pain freeall HEP                OT Education - 04/19/21 1840    Education Details progress and changes to HEP; ionto use    Person(s) Educated Patient    Methods Explanation;Demonstration;Tactile cues;Verbal cues;Handout    Comprehension Verbal cues required;Returned demonstration;Verbalized understanding            OT Short Term Goals - 03/22/21 1459      OT SHORT TERM GOAL #1   Title Pt to be independent in MLD and wearing of compression to decrease L UE circumference by 1 cm in wrist and forearm and 0.5 cm in upperarm and hand to show increase digits and wrist AROM    Baseline Circumference increase in L  hand and digits by 0.6 cm, wrist and forearm 2.2cm, elbow 0.9 cm and upper arm 0.4 cm - AROM in digits and wrist decrease see flowsheet  -NOW decrease in L UE to same as R UE - increase AROM in wrist and digits    Status Achieved  OT Long Term Goals - 03/22/21 1501      OT LONG  TERM GOAL #1   Title Pt edema in L UE decrease to less than 0.5 from digits to upper arm to be able to use hand in gripping ADL's objects, utencils and increase ease  on computer    Baseline increase from digits to upperarm - compare to R UE - see STG - not able to hold or girp objects - one finger at time on computer  NOW edema decrease to same as R -and AROM WFL in wrist and digits - but limited by shoulder AROM    Status Achieved      OT LONG TERM GOAL #2   Title L digits and wrist AROM increase to WNL to grip toys to  play with granddaugther ,  grip objects in kitchen at waist height    Baseline unable to use L hand to play or use at home- stiffness in wrist and digits - Wrist ext 35, flexion 50 , UD 25, RD 15 - MC 60-80's and PIP's 90-95's  NOW edema decrease to same as R and wrist and digits WFL and pain - but limited in use by shoulder AROM    Status Achieved      OT LONG TERM GOAL #3   Title Initiate grip strength and prehension in L hand    Baseline Only edema control and AROM at this time in L hand - NT - because of shoulder MRI and EMG scheduled    Time 2    Period Weeks    Status On-going    Target Date 04/05/21      OT LONG TERM GOAL #4   Title L elbow AROM increase to WNL to use on computer and use with bathing and dressing    Baseline feel pull  -cannot keep all the way extended the L elbow, supination decrease - cannot use on computer - only one finger at time- cannot use for bathing and dressing little - NOW elbow AROM WNL - but not able to use because of shoulder    Status Achieved      OT LONG TERM GOAL #5   Title L wrist and thumb strength increase to more than 4+/5 to grip objects during ADL's    Baseline decrease strength and stiffness- 4-/5 for wrist and thumb    Time 5    Period Weeks    Status New    Target Date 04/26/21                 Plan - 04/19/21 1428    Clinical Impression Statement Pt is about about 18 wks s/p R L shoulder arthroscopy - Pt was  seen about 2 wks ago -and made great progress in strength in forearm and grip - wrist ext, flex,RD,UD 4+/5 , sup/pro 4-/5 , pt using forearm while talking and holding objects - shoulders more even and looked more positive this date. Grip R 50lbs, L 21 lbs , Lat and 3 point decrease more than 50% too - no pain over 1st dorsal compartment with 2 lbs weight and putty this date - upgrade to 2 lbs for all - but flexion ,ext of wrist horizontal, and putty upgrade to medium green - pt to keep exercises pain free-  and can use Benik neoprene for so of the wrist HEP - t Nerve conduction showed normal nerves - Per pt show did some of her old shoulder HEP at home  and forearm stronger now, and arm not as heavy - Shoulder flexion and ABD 50-55 this date and shoulder ext 35 - L UE circumference staying steady -  cont to wear isotoner glove, tubigrip D hand to elbow and E mid forearm to upper arm  during day and night time -  off some in the morning - Pt to cont with HEP for another  2wks and return to OT -  cont to strengthening distal L UE elbow to hand , while maintaining circumference in LUE    OT Occupational Profile and History Problem Focused Assessment - Including review of records relating to presenting problem    Occupational performance deficits (Please refer to evaluation for details): ADL's;IADL's;Rest and Sleep;Work;Play;Leisure;Social Participation    Body Structure / Function / Physical Skills ADL;Coordination;IADL;Pain;Strength;Edema;Dexterity;FMC;Flexibility;ROM;UE functional use    Rehab Potential Good    Clinical Decision Making Several treatment options, min-mod task modification necessary    Comorbidities Affecting Occupational Performance: None    Modification or Assistance to Complete Evaluation  Min-Moderate modification of tasks or assist with assess necessary to complete eval    OT Frequency Biweekly    OT Duration 4 weeks    OT Treatment/Interventions Self-care/ADL training;Contrast  Bath;Therapeutic exercise;Manual lymph drainage;Patient/family education;Splinting;Compression bandaging;Manual Therapy;Passive range of motion    Consulted and Agree with Plan of Care Patient           Patient will benefit from skilled therapeutic intervention in order to improve the following deficits and impairments:   Body Structure / Function / Physical Skills: ADL,Coordination,IADL,Pain,Strength,Edema,Dexterity,FMC,Flexibility,ROM,UE functional use       Visit Diagnosis: Stiffness of left hand, not elsewhere classified  Muscle weakness (generalized)  Stiffness of left wrist, not elsewhere classified  Localized edema    Problem List There are no problems to display for this patient.   Rosalyn Gess  OTR/L,CLT 04/19/2021, 6:47 PM  [facility] [address] [place], [address] Phone: [phone]   Fax:  [phone]  Name: Teresa Rodriguez MRN: [id number] Date of Birth: 20-Dec-1970

## 2021-04-21 IMAGING — DX DG SHOULDER 1V*L*
2 series · 2 of 2 positions shown · non-contrast
Comparison: MRI 11/12/2020

CLINICAL DATA: Elective surgery, broken needle tip

EXAM:
LEFT SHOULDER-two view

[shoulder ap (1 of 2)]
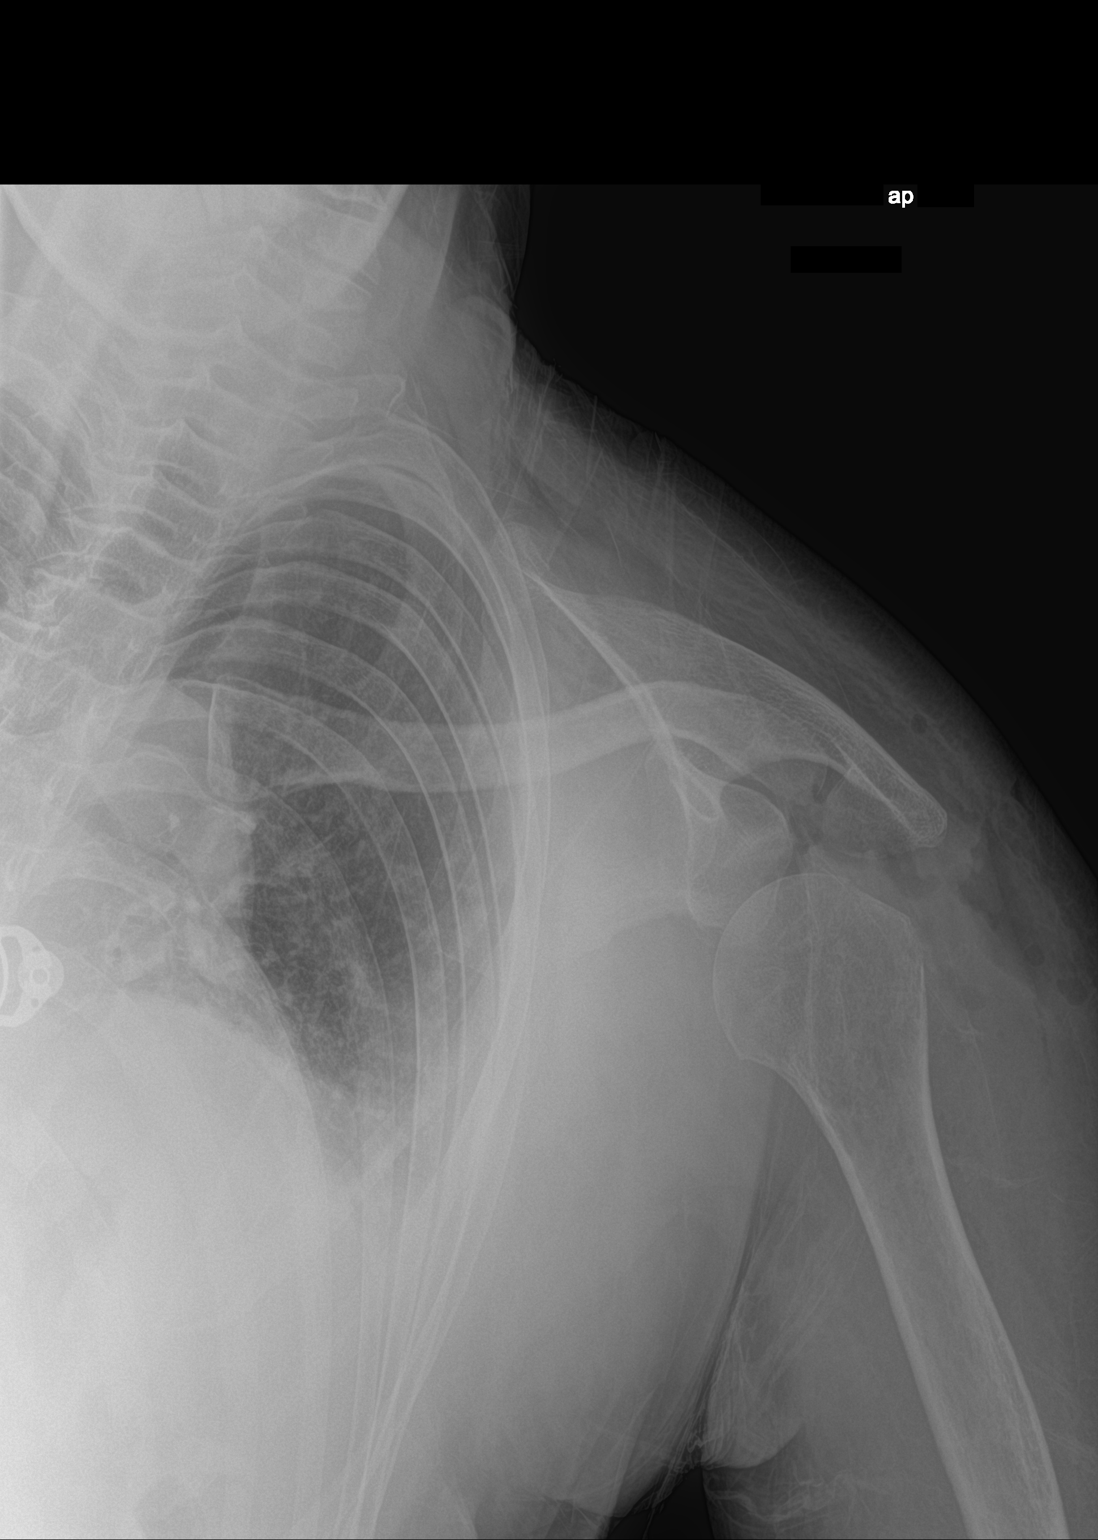

[shoulder ap (2 of 2)]
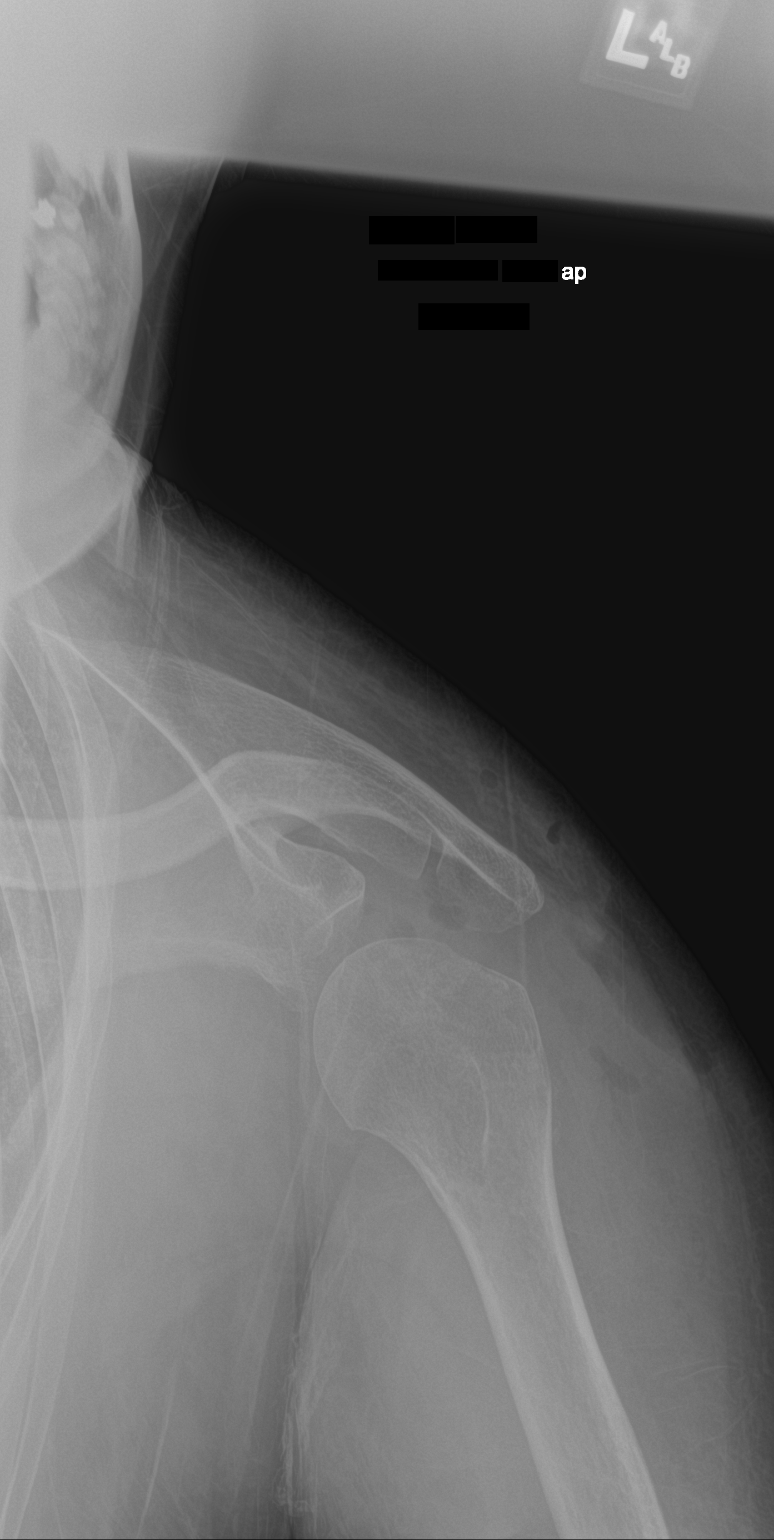

[2 of 2 positions shown; findings below may reference images not displayed]

FINDINGS: A single frontal AP projection of the left shoulder demonstrates gas
in the soft tissues related to recent operative intervention, along
with clothing or sheets in the region of the left shoulder. I not
discern an unexpected metal foreign body to represent a needle
within or around the visualized portion of the left shoulder.
IMPRESSION: 1. No unexpected metal foreign body within or around the visualized
portion of the left shoulder.

These results were called by telephone at the time of interpretation
on 12/07/2020 at [DATE] to operating room personnel, who verbally
acknowledged these results.

## 2021-05-04 ENCOUNTER — Other Ambulatory Visit: Payer: Self-pay

## 2021-05-04 ENCOUNTER — Ambulatory Visit: Payer: Managed Care, Other (non HMO) | Attending: Orthopedic Surgery | Admitting: Occupational Therapy

## 2021-05-04 DIAGNOSIS — M25642 Stiffness of left hand, not elsewhere classified: Secondary | ICD-10-CM | POA: Diagnosis present

## 2021-05-04 DIAGNOSIS — M25632 Stiffness of left wrist, not elsewhere classified: Secondary | ICD-10-CM | POA: Diagnosis present

## 2021-05-04 DIAGNOSIS — R600 Localized edema: Secondary | ICD-10-CM

## 2021-05-04 DIAGNOSIS — M6281 Muscle weakness (generalized): Secondary | ICD-10-CM

## 2021-05-04 NOTE — Therapy (Signed)
[facility] [address] [place], [address] Phone: [phone]   Fax:  [phone]  Occupational Therapy Treatment  Patient Details  Name: Teresa Rodriguez MRN: [id number] Date of Birth: 06-20-1971 Referring Provider (OT): Dr Posey Pronto   Encounter Date: 05/04/2021   OT End of Session - 05/04/21 1723    Visit Number 13    Number of Visits 17    Date for OT Re-Evaluation 06/29/21    OT Start Time 1430    OT Stop Time 1518    OT Time Calculation (min) 48 min    Activity Tolerance Patient tolerated treatment well    Behavior During Therapy [facility] for tasks assessed/performed           Past Medical History:  Diagnosis Date  . COVID-19 06/2020  . GERD (gastroesophageal reflux disease)   . Headache   . Hypertriglyceridemia   . Seasonal allergies     Past Surgical History:  Procedure Laterality Date  . OVARIAN CYST REMOVAL Right   . SHOULDER ARTHROSCOPY WITH ROTATOR CUFF REPAIR AND SUBACROMIAL DECOMPRESSION Left 12/07/2020   Procedure: Left shoulder mini-open rotator cuff repair, arthroscopic biceps tenodesis, and subacromial decompression;  Surgeon: Leim Fabry, MD;  Location: [place];  Service: Orthopedics;  Laterality: Left;  Teresa Rodriguez to Assist    There were no vitals filed for this visit.   Subjective Assessment - 05/04/21 1721    Subjective  Seen Dr Posey Pronto last week and had nerve conduction again and showed entrapment of nerve her above my shoulder where I feel the pain - his consulting with some surgeons and will let me know - but to hold off on my PT and cont with you - hand feels stronger - using it more -I can pull up my pants and fasten buttons and zip now- 2 lbs weight easier  and doing 3 sets - and putty 3 sets - swelling staying about same now - wearing compression about 50% during week    Pertinent History DR Posey Pronto did left shoulder arthroscopy 12/07/20     12/07/20:  PROCEDURES:   1.  Left mini open rotator cuff repair  2. Left arthroscopic biceps tenodesis  3. Left subacromial decompression  4. Left extensive debridement of shoulder (glenohumeral and subacromial spaces)    History of Present Illness:  02/02/2021:  She was on 02/01/21 approximately 8 weeks after undergoing above procedures. She returns to the office earlier than anticipated due to persistent left hand and forearm swelling and difficulty using her fingers. She also notes occasional color changes if her hand is in a dependent position for prolonged period of time. She also notes that the left hand feels warmer than the right hand frequently. She does not have exquisite pain in her hands although there are certain areas that are more tender than what she would expect - refer to hand therapy - ? CRPS    Patient Stated Goals Want to get the swelling and heavy feeling better so I can use my hand    Currently in Pain? Yes    Pain Score 2     Pain Location Shoulder    Pain Orientation Left    Pain Descriptors / Indicators Tightness;Aching    Pain Type Acute pain;Surgical pain    Pain Onset More than a month ago    Pain Frequency Intermittent              [facility] OT Assessment -  05/04/21 0001      AROM   Left Shoulder Extension 35 Degrees    Left Shoulder Flexion 50 Degrees    Left Shoulder ABduction 60 Degrees      Strength   Right Hand Grip (lbs) 50    Right Hand Lateral Pinch 13 lbs    Right Hand 3 Point Pinch 12 lbs    Left Hand Grip (lbs) 25    Left Hand Lateral Pinch 9 lbs    Left Hand 3 Point Pinch 6 lbs             Measurements taken of L UEcircumference last time-greatprogress indecongestion of L UEsince SOC -About same as R UE  cont to wear compression tubigrip D and E on L UE and isotoner glove- but can wean at times out of proximal tubigip  Pt report did not had any pain1st dorsal compartment  and this date no pain with doing 2 lbs weight for wrist - pt can stop wearing Benik  neoprene  cont with 1 lbs for sup and pronation because of anterior upper arm pain  But cont 2 lbs weight 2-3 sets without Benik neoprene - pain better for  UD and RD and wrist flexion , extention over armrest  Cont tendon glides and thumb PA and RA- opposition Contsome isometric for thumb in all planes - 12 reps  And puttyupgrade to firm green - for  gripping 1 x12 reps And  lat and 3 point grip - 12 reps - 1 x day - then increase 2nd and 3rd set over the next 2 wks  Keep pain free - needed min A for lat grip -did not do that one correctly   Pain freeall HEP  Pt can cont with supine for shoulder over head using cane - flexion , horizontal ABD, chest press and ext rotation  Can also do sitting at table - AAROM for shoulder flexion and ABD - 3-5 sec hold - 10reps  Rolling large ball on table into flexoin -forearm in neutral position and out 45 degrees angle                  OT Education - 05/04/21 1723    Education Details progress and changes to HEP; ionto use    Person(s) Educated Patient    Methods Explanation;Demonstration;Tactile cues;Verbal cues;Handout    Comprehension Verbal cues required;Returned demonstration;Verbalized understanding            OT Short Term Goals - 03/22/21 1459      OT SHORT TERM GOAL #1   Title Pt to be independent in MLD and wearing of compression to decrease L UE circumference by 1 cm in wrist and forearm and 0.5 cm in upperarm and hand to show increase digits and wrist AROM    Baseline Circumference increase in L  hand and digits by 0.6 cm, wrist and forearm 2.2cm, elbow 0.9 cm and upper arm 0.4 cm - AROM in digits and wrist decrease see flowsheet  -NOW decrease in L UE to same as R UE - increase AROM in wrist and digits    Status Achieved             OT Long Term Goals - 05/04/21 1725      OT LONG TERM GOAL #1   Title Pt edema in L UE decrease to less than 0.5 from digits to upper arm to be able to use hand in  gripping ADL's objects, utencils and increase ease  on computer    Status Achieved      OT LONG TERM GOAL #2   Title L digits and wrist AROM increase to WNL to grip toys to  play with granddaugther ,  grip objects in kitchen at waist height    Baseline unable to use L hand to play or use at home- stiffness in wrist and digits - Wrist ext 35, flexion 50 , UD 25, RD 15 - MC 60-80's and PIP's 90-95's  NOW edema decrease to same as R and wrist and digits WFL and pain - but limited in use by shoulder AROM _ 05/04/21 pt able to grip and do zips/buttons/ 25lbs of grip and 9 lat grip, 6 for 3 point grip - shoulder limiting function above waist    Status Achieved      OT LONG TERM GOAL #3   Title Initiate grip strength and prehension in L hand    Baseline Only edema control and AROM at this time in L hand - NT - because of shoulder MRI and EMG scheduled - cleared and done - grip increase 25 lbs, lat 9 lbs and 3 poin 6 lbs    Status Achieved      OT LONG TERM GOAL #4   Title L elbow AROM increase to WNL to use on computer and use with bathing and dressing    Baseline feel pull  -cannot keep all the way extended the L elbow, supination decrease - cannot use on computer - only one finger at time- cannot use for bathing and dressing little - NOW elbow AROM WNL - but not able to use above waist because of shoulder motion    Status Achieved      OT LONG TERM GOAL #5   Title L wrist and thumb strength increase to more than 4+/5 to grip objects during ADL's    Baseline strength 4/5 for wrist and thumb PA /RA- can do buttons and zips now    Time 8    Period Weeks    Status On-going    Target Date 08/03/21                 Plan - 05/04/21 1731    Clinical Impression Statement Pt is about about 20 wks s/p R L shoulder arthroscopy - Pt was seen about 2 wks ago -and making every time great progress in trength in L wrist, forearm and grip/prhension -  - wrist ext, flex,RD,UD 4+/5 , sup/pro 4/5 , pt using  forearm while talking and holding objects - shoulders more even and looked more positive this date. Grip R 50lbs, L 25 lbs , Lat and 3 point  improve to 9 and 6 lbs- and upgrade this date her putty resistance - able to do 2 lbs weight for wrist except supination because of some pain over anterior upper arm - pt to keep exercises pain free-  and can use Benik neoprene as needed -but this date did not need it for RD/UD -  Nerve conduction showed  entrapment of nerve per pt over upper traps and above shoulder- Pt to cont to show some increase L shoulder flexion , ext- and pt to cont with some HEP for AAROM and PROM in sitting and supine - L arm is not as heavy as it was prior - Shoulder flexion and ABD 50- 60 this date and shoulder ext 35 - L UE circumference staying steady -  cont to wear isotoner glove, tubigrip D hand  to elbow and E mid forearm to upper arm  during day and night time -  off some in the morning - Pt to cont with HEP for another  2wks and return to OT -  cont to strengthening distal L UE elbow to hand , while prefroming AAROM for shoulder and maintaining circumference in LUE    OT Occupational Profile and History Problem Focused Assessment - Including review of records relating to presenting problem    Occupational performance deficits (Please refer to evaluation for details): ADL's;IADL's;Rest and Sleep;Work;Play;Leisure;Social Participation    Body Structure / Function / Physical Skills ADL;Coordination;IADL;Pain;Strength;Edema;Dexterity;FMC;Flexibility;ROM;UE functional use    Rehab Potential Good    Clinical Decision Making Several treatment options, min-mod task modification necessary    Comorbidities Affecting Occupational Performance: None    Modification or Assistance to Complete Evaluation  Min-Moderate modification of tasks or assist with assess necessary to complete eval    OT Frequency Biweekly    OT Duration 8 weeks    OT Treatment/Interventions Self-care/ADL training;Contrast  Bath;Therapeutic exercise;Manual lymph drainage;Patient/family education;Splinting;Compression bandaging;Manual Therapy;Passive range of motion    Consulted and Agree with Plan of Care Patient           Patient will benefit from skilled therapeutic intervention in order to improve the following deficits and impairments:   Body Structure / Function / Physical Skills: ADL,Coordination,IADL,Pain,Strength,Edema,Dexterity,FMC,Flexibility,ROM,UE functional use       Visit Diagnosis: Muscle weakness (generalized) - Plan: Ot plan of care cert/re-cert  Stiffness of left wrist, not elsewhere classified - Plan: Ot plan of care cert/re-cert  Localized edema - Plan: Ot plan of care cert/re-cert  Stiffness of left hand, not elsewhere classified - Plan: Ot plan of care cert/re-cert    Problem List There are no problems to display for this patient.   Rosalyn Gess OTR/L,CLT 05/04/2021, 5:39 PM  [facility] [address] [place], [address] Phone: [phone]   Fax:  [phone]  Name: Teresa Rodriguez MRN: [id number] Date of Birth: 09-Jul-1971

## 2021-05-18 ENCOUNTER — Other Ambulatory Visit: Payer: Self-pay

## 2021-05-18 ENCOUNTER — Ambulatory Visit: Payer: Managed Care, Other (non HMO) | Admitting: Occupational Therapy

## 2021-05-18 DIAGNOSIS — M6281 Muscle weakness (generalized): Secondary | ICD-10-CM | POA: Diagnosis not present

## 2021-05-18 DIAGNOSIS — M25632 Stiffness of left wrist, not elsewhere classified: Secondary | ICD-10-CM

## 2021-05-18 DIAGNOSIS — R600 Localized edema: Secondary | ICD-10-CM

## 2021-05-18 DIAGNOSIS — M25642 Stiffness of left hand, not elsewhere classified: Secondary | ICD-10-CM

## 2021-05-18 NOTE — Therapy (Signed)
[facility] [address] [place], [address] Phone: [phone]   Fax:  [phone]  Occupational Therapy Treatment  Patient Details  Name: Teresa Rodriguez MRN: [id number] Date of Birth: 1971/03/21 Referring Provider (OT): Dr Posey Pronto   Encounter Date: 05/18/2021   OT End of Session - 05/18/21 1542     Visit Number 14    Number of Visits 17    Date for OT Re-Evaluation 06/29/21    OT Start Time 1445    OT Stop Time 1534    OT Time Calculation (min) 49 min    Activity Tolerance Patient tolerated treatment well    Behavior During Therapy [facility] for tasks assessed/performed             Past Medical History:  Diagnosis Date   COVID-19 06/2020   GERD (gastroesophageal reflux disease)    Headache    Hypertriglyceridemia    Seasonal allergies     Past Surgical History:  Procedure Laterality Date   OVARIAN CYST REMOVAL Right    SHOULDER ARTHROSCOPY WITH ROTATOR CUFF REPAIR AND SUBACROMIAL DECOMPRESSION Left 12/07/2020   Procedure: Left shoulder mini-open rotator cuff repair, arthroscopic biceps tenodesis, and subacromial decompression;  Surgeon: Leim Fabry, MD;  Location: [place];  Service: Orthopedics;  Laterality: Left;  Reche Dixon to Assist    There were no vitals filed for this visit.   Subjective Assessment - 05/18/21 1540     Subjective  I talked to Dr Posey Pronto and have appt with surgeon at [facility] that does the type of surgery I need for the nerve entrapment at shoulder- he wants to evaluate me - so then we'll go from there- doing okay with 2 lbs weight for wrist , bending elbow - done the putty- still cannot sleep in bed -and doing my shoulder exercises on ball and lying down    Pertinent History DR Posey Pronto did left shoulder arthroscopy 12/07/20     12/07/20:  PROCEDURES:   1. Left mini open rotator cuff repair  2. Left arthroscopic biceps tenodesis  3. Left subacromial decompression  4. Left  extensive debridement of shoulder (glenohumeral and subacromial spaces)    History of Present Illness:  02/02/2021:  She was on 02/01/21 approximately 8 weeks after undergoing above procedures. She returns to the office earlier than anticipated due to persistent left hand and forearm swelling and difficulty using her fingers. She also notes occasional color changes if her hand is in a dependent position for prolonged period of time. She also notes that the left hand feels warmer than the right hand frequently. She does not have exquisite pain in her hands although there are certain areas that are more tender than what she would expect - refer to hand therapy - ? CRPS    Patient Stated Goals Want to get the swelling and heavy feeling better so I can use my hand    Currently in Pain? Yes    Pain Score 2     Pain Location Shoulder    Pain Orientation Left    Pain Descriptors / Indicators Aching;Tightness    Pain Type Acute pain;Surgical pain    Pain Onset More than a month ago                [facility] OT Assessment - 05/18/21 0001       AROM   Left Shoulder Extension 40 Degrees    Left Shoulder Flexion 58 Degrees  Left Shoulder ABduction 65 Degrees      Strength   Right Hand Grip (lbs) 50    Right Hand Lateral Pinch 13 lbs    Right Hand 3 Point Pinch 12 lbs    Left Hand Grip (lbs) 25    Left Hand Lateral Pinch 9 lbs    Left Hand 3 Point Pinch 7 lbs             LYMPHEDEMA/ONCOLOGY QUESTIONNAIRE - 05/18/21 0001       Right Upper Extremity Lymphedema   Olecranon Process 26.2 cm    15 cm Proximal to Ulnar Styloid Process 25.5 cm    10 cm Proximal to Ulnar Styloid Process 22.3 cm    Just Proximal to Ulnar Styloid Process 17 cm    Across Hand at [employer] 19.2 cm      Left Upper Extremity Lymphedema   15 cm Proximal to Olecranon Process 27.8 cm    10 cm Proximal to Olecranon Process 27 cm    Olecranon Process 25.5 cm    15 cm Proximal to Ulnar Styloid Process 24.2 cm    10  cm Proximal to Ulnar Styloid Process 21 cm    Just Proximal to Ulnar Styloid Process 17 cm    Across Hand at [employer] 18.5 cm               Measurements taken of L UE circumference about same as R UE -but loosing muscle mass on upper arm  cont to wear compression tubigrip D and E on L UE  as needed and isotoner glove - but can wean at times out of proximal tubigip   Pt report did not had any pain 1st dorsal compartment  using 2 lbs for wrist in all planes    cont with 1 lbs for  sup and pronation because of anterior upper arm pain M/M test for wrist in all planes 5/5 , sup , pronation 4/5 with pain end range     Putty firm green - for  gripping   12 reps  And  lat and 3 point grip - 12 reps -  3 sets pain free     Pt can cont with supine for shoulder over head using cane - flexion , horizontal ABD, chest press and ext rotation Can also do sitting at table - AAROM for shoulder flexion and ABD - 3-5 sec hold - 10reps Rolling large ball on table into flexoin -forearm in neutral position and out 45 degrees angle  Protraction/ retraction on ball or on table 10 reps pain free YTB add for scapula retraction and shoulder extention - 10 reps- but need to position scapula prior to shoulder extention RTB for elbow extention 2 position -10reps Can increase over the next 3 wks to 2nd and 3rd set if needed and pain free  Pt to see DR Shelly Flatten tomorrow for shoulder nerve entrapment - will let me know if any changes or plans for surgery                 OT Education - 05/18/21 1542     Education Details progress and changes to HEP    Person(s) Educated Patient    Methods Explanation;Demonstration;Tactile cues;Verbal cues;Handout    Comprehension Verbal cues required;Returned demonstration;Verbalized understanding              OT Short Term Goals - 03/22/21 1459       OT SHORT TERM GOAL #1  Title Pt to be independent in MLD and wearing of compression to decrease L  UE circumference by 1 cm in wrist and forearm and 0.5 cm in upperarm and hand to show increase digits and wrist AROM    Baseline Circumference increase in L  hand and digits by 0.6 cm, wrist and forearm 2.2cm, elbow 0.9 cm and upper arm 0.4 cm - AROM in digits and wrist decrease see flowsheet  -NOW decrease in L UE to same as R UE - increase AROM in wrist and digits    Status Achieved               OT Long Term Goals - 05/04/21 1725       OT LONG TERM GOAL #1   Title Pt edema in L UE decrease to less than 0.5 from digits to upper arm to be able to use hand in gripping ADL's objects, utencils and increase ease  on computer    Status Achieved      OT LONG TERM GOAL #2   Title L digits and wrist AROM increase to WNL to grip toys to  play with granddaugther ,  grip objects in kitchen at waist height    Baseline unable to use L hand to play or use at home- stiffness in wrist and digits - Wrist ext 35, flexion 50 , UD 25, RD 15 - MC 60-80's and PIP's 90-95's  NOW edema decrease to same as R and wrist and digits WFL and pain - but limited in use by shoulder AROM _ 05/04/21 pt able to grip and do zips/buttons/ 25lbs of grip and 9 lat grip, 6 for 3 point grip - shoulder limiting function above waist    Status Achieved      OT LONG TERM GOAL #3   Title Initiate grip strength and prehension in L hand    Baseline Only edema control and AROM at this time in L hand - NT - because of shoulder MRI and EMG scheduled - cleared and done - grip increase 25 lbs, lat 9 lbs and 3 poin 6 lbs    Status Achieved      OT LONG TERM GOAL #4   Title L elbow AROM increase to WNL to use on computer and use with bathing and dressing    Baseline feel pull  -cannot keep all the way extended the L elbow, supination decrease - cannot use on computer - only one finger at time- cannot use for bathing and dressing little - NOW elbow AROM WNL - but not able to use above waist because of shoulder motion    Status Achieved       OT LONG TERM GOAL #5   Title L wrist and thumb strength increase to more than 4+/5 to grip objects during ADL's    Baseline strength 4/5 for wrist and thumb PA /RA- can do buttons and zips now    Time 8    Period Weeks    Status On-going    Target Date 08/03/21                   Plan - 05/18/21 1543     Clinical Impression Statement Pt is about about 22 wks s/p R L shoulder arthroscopy - Pt was seen about 2 wks ago -and making every time great progress in strength in L wrist, forearm and prehension  - wrist ext, flex,RD,UD 5/5 , sup/pro 4/5 with some pain end range. Pt do  use hand and L UE while talking and holding objects - shoulders even. Grip R 50lbs, L 25 lbs , Lat and 3 point  improve to 9 and 7 lbs.. Pt to cont with 2 lbs weight for wrist except supination because of some pain over anterior upper arm. Pt to keep exercises pain free-  and can use Benik neoprene as needed. Nerve conduction showed  entrapment of nerve per pt over upper traps and above shoulder- Appt with surgeon tomorrow at [facility]. Pt show increase elbow flexion strength and increase shoulder AROM in all planes by 5 degrees. Pt to cont with HEP for AAROM and PROM in standing and supine for L shoulder - upgrade to tricep RTB and scapula retraction and shoulder extention using YTB. L UE circumference decrease - but because pt loosing muscle mass at upper arm partially. Cont wearing isotoner glove, tubigrip D hand to elbow and E mid forearm to upper arm  during day and night time -  off at home. Pt to cont with HEP for another  3 wks and return to OT or PT if MD want pre PT prior to surgery -  cont strengthening  L UE elbow to hand , while preforming AAROM for shoulder and maintaining circumference in LUE    OT Occupational Profile and History Problem Focused Assessment - Including review of records relating to presenting problem    Occupational performance deficits (Please refer to evaluation for details): ADL's;IADL's;Rest and  Sleep;Work;Play;Leisure;Social Participation    Body Structure / Function / Physical Skills ADL;Coordination;IADL;Pain;Strength;Edema;Dexterity;FMC;Flexibility;ROM;UE functional use    Rehab Potential Good    Clinical Decision Making Several treatment options, min-mod task modification necessary    Comorbidities Affecting Occupational Performance: None    Modification or Assistance to Complete Evaluation  Min-Moderate modification of tasks or assist with assess necessary to complete eval    OT Frequency --   3 wks   OT Duration 8 weeks    OT Treatment/Interventions Self-care/ADL training;Contrast Bath;Therapeutic exercise;Manual lymph drainage;Patient/family education;Splinting;Compression bandaging;Manual Therapy;Passive range of motion    Consulted and Agree with Plan of Care Patient             Patient will benefit from skilled therapeutic intervention in order to improve the following deficits and impairments:   Body Structure / Function / Physical Skills: ADL, Coordination, IADL, Pain, Strength, Edema, Dexterity, FMC, Flexibility, ROM, UE functional use       Visit Diagnosis: Stiffness of left wrist, not elsewhere classified  Localized edema  Stiffness of left hand, not elsewhere classified  Muscle weakness (generalized)    Problem List There are no problems to display for this patient.   Rosalyn Gess OTR/L,CLT 05/18/2021, 3:52 PM  [facility] [address], [place], [address] Phone: [phone]   Fax:  [phone]  Name: Teresa Rodriguez MRN: [id number] Date of Birth: [birth year]

## 2021-06-07 ENCOUNTER — Encounter: Payer: Managed Care, Other (non HMO) | Admitting: Occupational Therapy

## 2021-08-08 ENCOUNTER — Ambulatory Visit
Admission: RE | Admit: 2021-08-08 | Discharge: 2021-08-08 | Disposition: A | Discharge status: Home / Self Care | Payer: Managed Care, Other (non HMO) | Source: Ambulatory Visit | Attending: Emergency Medicine | Admitting: Emergency Medicine

## 2021-08-08 ENCOUNTER — Other Ambulatory Visit: Payer: Self-pay

## 2021-08-08 VITALS — BP 122/80 | HR 103 | Temp 98.8°F | Resp 14 | Ht 64.0 in | Wt 145.0 lb

## 2021-08-08 DIAGNOSIS — H9201 Otalgia, right ear: Secondary | ICD-10-CM | POA: Diagnosis not present

## 2021-08-08 DIAGNOSIS — Z20822 Contact with and (suspected) exposure to covid-19: Secondary | ICD-10-CM | POA: Insufficient documentation

## 2021-08-08 DIAGNOSIS — Z79899 Other long term (current) drug therapy: Secondary | ICD-10-CM | POA: Insufficient documentation

## 2021-08-08 DIAGNOSIS — Z8616 Personal history of COVID-19: Secondary | ICD-10-CM | POA: Diagnosis not present

## 2021-08-08 DIAGNOSIS — J029 Acute pharyngitis, unspecified: Secondary | ICD-10-CM | POA: Diagnosis present

## 2021-08-08 DIAGNOSIS — J069 Acute upper respiratory infection, unspecified: Secondary | ICD-10-CM | POA: Insufficient documentation

## 2021-08-08 LAB — POCT RAPID STREP A: Streptococcus, Group A Screen (Direct): NEGATIVE

## 2021-08-08 MED ORDER — IPRATROPIUM BROMIDE 0.06 % NA SOLN: 2.0000 | Freq: Four times a day (QID) | NASAL | 12 refills | Status: AC

## 2021-08-08 NOTE — ED Triage Notes (Signed)
Patient c/o right ear pain and sore throat that started yesterday.  Patient denies fevers.

## 2021-08-08 NOTE — Discharge Instructions (Addendum)
Use the Atrovent nasal spray, 2 squirts in each nostril every 6 hours, as needed for runny nose and postnasal drip.  Perform sinus irrigation 2-3 times a day with a NeilMed sinus rinse kit and distilled water.  Do not use tap water.  Increase your oral fluid intake to thin out your mucus so that is also able for your body to clear more easily.  Take over-the-counter Zyrtec  once daily to help with allergic symptoms.  Continue to equalize your ears as shown to help clear mucus from eustachian tubes and maintain patency.    Return for reevaluation or see your primary care provider for any new or worsening symptoms.

## 2021-08-08 NOTE — ED Provider Notes (Signed)
[facility]    CSN: [id number] Arrival date & time: 08/08/21  1244      History   Chief Complaint Chief Complaint  Patient presents with   Appointment   Otalgia   Sore Throat    HPI Teresa Rodriguez is a 50 y.o. female.   HPI  50 year old female here for evaluation of right ear pain and sore throat.  Patient reports that her symptoms started yesterday.  She has had some nasal congestion but denies runny nose because she is taking Zyrtec currently.  The sore throat is mostly on the right side.  She is also been experiencing an intermittent nonproductive cough.  Her daughter currently has similar symptoms.  She denies any changes in hearing or ringing in her right ear.  Past Medical History:  Diagnosis Date   COVID-19 06/2020   GERD (gastroesophageal reflux disease)    Headache    Hypertriglyceridemia    Seasonal allergies     There are no problems to display for this patient.   Past Surgical History:  Procedure Laterality Date   OVARIAN CYST REMOVAL Right    SHOULDER ARTHROSCOPY WITH ROTATOR CUFF REPAIR AND SUBACROMIAL DECOMPRESSION Left 12/07/2020   Procedure: Left shoulder mini-open rotator cuff repair, arthroscopic biceps tenodesis, and subacromial decompression;  Surgeon: Leim Fabry, MD;  Location: [place];  Service: Orthopedics;  Laterality: Left;  Reche Dixon to Assist    OB History   No obstetric history on file.      Home Medications    Prior to Admission medications   Medication Sig Start Date End Date Taking? Authorizing Provider  cetirizine (ZYRTEC) 10 MG tablet Take 10 mg by mouth daily.   Yes [provider]  ferrous sulfate 325 (65 FE) MG tablet Take 325 mg by mouth daily with breakfast.   Yes [provider]  ipratropium (ATROVENT) 0.06 % nasal spray Place 2 sprays into both nostrils 4 (four) times daily. 08/08/21  Yes Margarette Canada, NP  Multiple Vitamins-Minerals (MULTIVITAMIN WITH MINERALS) tablet  Take 1 tablet by mouth daily.   Yes [provider]  traMADol (ULTRAM) 50 MG tablet Take 50 mg by mouth every 6 (six) hours as needed. 06/25/21  Yes [provider]  vitamin B-12 (CYANOCOBALAMIN) 500 MCG tablet Take 500 mcg by mouth daily.   Yes [provider]  acetaminophen (TYLENOL) 500 MG tablet Take 2 tablets (1,000 mg total) by mouth every 8 (eight) hours. 12/07/20 12/07/21  Leim Fabry, MD  benzonatate (TESSALON) 100 MG capsule Take 1 capsule (100 mg total) by mouth every 8 (eight) hours. Patient not taking: Reported on 11/24/2020 07/26/20   Sharion Balloon, NP  carboxymethylcellulose (REFRESH PLUS) 0.5 % SOLN Place 1 drop into both eyes 3 (three) times daily as needed (dry eyes).    [provider]  Cholecalciferol 25 MCG (1000 UT) tablet Take by mouth.    [provider]  clonazePAM (KLONOPIN) 0.5 MG tablet Take 0.5 mg by mouth at bedtime as needed for anxiety. 08/13/18   [provider]  fluticasone (FLONASE) 50 MCG/ACT nasal spray Place into the nose.    [provider]  Methylsulfonylmethane (MSM) POWD Take 3 Scoops by mouth daily.    [provider]  olopatadine (PATANOL) 0.1 % ophthalmic solution Place 1 drop into both eyes 2 (two) times daily as needed for allergies.    [provider]  ondansetron (ZOFRAN ODT) 4 MG disintegrating tablet Take 1 tablet (4 mg total) by  mouth every 8 (eight) hours as needed for nausea or vomiting. 12/07/20   Leim Fabry, MD    Family History Family History  Problem Relation Age of Onset   Hyperlipidemia Mother    Pancreatic cancer Father    Hypertension Father    Breast cancer Neg Hx     Social History Social History   Tobacco Use   Smoking status: Never    Passive exposure: Never   Smokeless tobacco: Never  Vaping Use   Vaping Use: Never used  Substance Use Topics   Alcohol use: Yes    Comment: occasional   Drug use: Never     Allergies    Cyclobenzaprine   Review of Systems Review of Systems  Constitutional:  Negative for activity change and fever.  HENT:  Positive for congestion, ear pain, postnasal drip and sore throat. Negative for ear discharge and tinnitus.   Respiratory:  Positive for cough. Negative for shortness of breath and wheezing.   Hematological: Negative.   Psychiatric/Behavioral: Negative.      Physical Exam Triage Vital Signs ED Triage Vitals  Enc Vitals Group     BP 08/08/21 1322 122/80     Pulse Rate 08/08/21 1322 (!) 103     Resp 08/08/21 1322 14     Temp 08/08/21 1322 98.8 F (37.1 C)     Temp Source 08/08/21 1322 Oral     SpO2 08/08/21 1322 98 %     Weight 08/08/21 1318 145 lb (65.8 kg)     Height 08/08/21 1318 5' 4" (1.626 m)     Head Circumference --      Peak Flow --      Pain Score 08/08/21 1317 3     Pain Loc --      Pain Edu? --      Excl. in [place]? --    No data found.  Updated Vital Signs BP 122/80 (BP Location: Left Arm)   Pulse (!) 103   Temp 98.8 F (37.1 C) (Oral)   Resp 14   Ht 5' 4" (1.626 m)   Wt 145 lb (65.8 kg)   LMP 07/18/2021 (Approximate)   SpO2 98%   BMI 24.89 kg/m   Visual Acuity Right Eye Distance:   Left Eye Distance:   Bilateral Distance:    Right Eye Near:   Left Eye Near:    Bilateral Near:     Physical Exam Vitals and nursing note reviewed.  Constitutional:      General: She is not in acute distress.    Appearance: Normal appearance. She is not ill-appearing.  HENT:     Head: Normocephalic and atraumatic.     Right Ear: Tympanic membrane, ear canal and external ear normal. There is no impacted cerumen.     Left Ear: Tympanic membrane, ear canal and external ear normal. There is no impacted cerumen.     Nose: Congestion and rhinorrhea present.     Mouth/Throat:     Mouth: Mucous membranes are moist.     Pharynx: Oropharynx is clear. Posterior oropharyngeal erythema present.  Cardiovascular:     Rate and Rhythm: Normal rate and regular  rhythm.     Pulses: Normal pulses.     Heart sounds: Normal heart sounds. No murmur heard.   No gallop.  Pulmonary:     Effort: Pulmonary effort is normal.     Breath sounds: Normal breath sounds. No wheezing, rhonchi or rales.  Musculoskeletal:     Cervical back:  Normal range of motion and neck supple.  Lymphadenopathy:     Cervical: Cervical adenopathy present.  Skin:    General: Skin is warm and dry.     Capillary Refill: Capillary refill takes less than 2 seconds.     Findings: No erythema or rash.  Neurological:     General: No focal deficit present.     Mental Status: She is alert and oriented to person, place, and time.  Psychiatric:        Mood and Affect: Mood normal.        Behavior: Behavior normal.        Thought Content: Thought content normal.        Judgment: Judgment normal.     UC Treatments / Results  Labs (all labs ordered are listed, but only abnormal results are displayed) Labs Reviewed  SARS CORONAVIRUS 2 (TAT 6-24 HRS)  CULTURE, GROUP A STREP [facility])  POCT RAPID STREP A, ED / UC  POCT RAPID STREP A    EKG   Radiology No results found.  Procedures Procedures (including critical care time)  Medications Ordered in UC Medications - No data to display  Initial Impression / Assessment and Plan / UC Course  I have reviewed the triage vital signs and the nursing notes.  Pertinent labs & imaging results that were available during my care of the patient were reviewed by me and considered in my medical decision making (see chart for details).  Patient is a very pleasant, nontoxic-appearing 50 year old female here for evaluation of right ear pain and sore throat that started yesterday and are associated with nasal congestion and an intermittent nonproductive cough.  Patient's physical exam reveals pearly gray tympanic membranes bilaterally with a normal light reflex and clear external auditory canals.  Nasal mucosa is markedly erythematous and edematous  with scant clear nasal discharge.  Oropharyngeal exam reveals posterior oropharyngeal cobblestoning and erythema with clear postnasal drip.  Tonsillar pillars are unremarkable.  Patient does have bilateral anterior cervical lymphadenopathy that is more prominent on the right than the left.  Patient denies tenderness with palpation of bilateral eustachian tubes externally.  Cardiopulmonary exam reveals clear lung sounds in all fields.  Rapid strep was collected at triage and is negative.  Will send for culture.  Sent COVID test was also collected at triage.  Patient exam is consistent with a viral URI and will treat accordingly with Atrovent nasal spray to help with nasal congestion, sinus irrigation to help with nasal congestion and prevent mucus accumulation in her nasal passages which could be to infection.  Sinus irrigation will also help clear her eustachian tubes that may be blocked as patient reports when she chews she has increased pressure in her right ear.  I have also discussed how to equalize the ears with patient.   Final Clinical Impressions(s) / UC Diagnoses   Final diagnoses:  Upper respiratory tract infection, unspecified type     Discharge Instructions      Use the Atrovent nasal spray, 2 squirts in each nostril every 6 hours, as needed for runny nose and postnasal drip.  Perform sinus irrigation 2-3 times a day with a [employer] sinus rinse kit and distilled water.  Do not use tap water.  Increase your oral fluid intake to thin out your mucus so that is also able for your body to clear more easily.  Take over-the-counter Zyrtec  once daily to help with allergic symptoms.  Continue to equalize your ears as shown to  help clear mucus from eustachian tubes and maintain patency.    Return for reevaluation or see your primary care provider for any new or worsening symptoms.      ED Prescriptions     Medication Sig Dispense Auth. Provider   ipratropium (ATROVENT) 0.06 % nasal  spray Place 2 sprays into both nostrils 4 (four) times daily. 15 mL Margarette Canada, NP      PDMP not reviewed this encounter.   Margarette Canada, NP 08/08/21 1400

## 2021-08-09 LAB — SARS CORONAVIRUS 2 (TAT 6-24 HRS): SARS Coronavirus 2: NEGATIVE

## 2021-08-11 LAB — CULTURE, GROUP A STREP (THRC)

## 2021-11-18 ENCOUNTER — Ambulatory Visit: Payer: Managed Care, Other (non HMO)

## 2022-06-08 ENCOUNTER — Other Ambulatory Visit: Payer: Self-pay | Admitting: Family Medicine

## 2022-06-08 DIAGNOSIS — Z1231 Encounter for screening mammogram for malignant neoplasm of breast: Secondary | ICD-10-CM

## 2022-06-13 ENCOUNTER — Ambulatory Visit
Admission: RE | Admit: 2022-06-13 | Discharge: 2022-06-13 | Disposition: A | Discharge status: Home / Self Care | Payer: Managed Care, Other (non HMO) | Source: Ambulatory Visit | Attending: Family Medicine | Admitting: Family Medicine

## 2022-06-13 DIAGNOSIS — Z1231 Encounter for screening mammogram for malignant neoplasm of breast: Secondary | ICD-10-CM | POA: Insufficient documentation

## 2023-08-31 ENCOUNTER — Other Ambulatory Visit: Payer: Self-pay | Admitting: Obstetrics and Gynecology

## 2023-09-06 ENCOUNTER — Other Ambulatory Visit: Payer: Managed Care, Other (non HMO)

## 2023-09-06 NOTE — H&P (Signed)
Teresa Rodriguez is a 52 y.o. female here for L/S BSO .pt here for follow up for left ovarian cyst  Asymptomatic   Visualized. Size 117 mm x 67 mm x 62 mm enlarged Position: anteverted Malformations: none Myometrium: Heterogeneous Endometrium: appears normal/appropriate. Endometrial thickness, total 13.6 mm Cervix details: nabothian cysts visualized; largest measures 16mm No polyps identified Fibroid(s) uterus multimyomatosus.        1.  Size 44.00 mm x 27.00 mm x 37 mm. Mean 36.0 mm. Vol 23.0 cm. FIGO - 6 (subserosal but < 50% intramural). Posterior        2.  Size 14.00 mm x 8.00 mm x 10 mm. Mean 10.7 mm. Vol 0.6 cm. FIGO - 2 (submucosal with > 50% intramural component)   Right Ovary =========   Not visualized, Removed No cysts identified Follicles identified   Left Ovary ========   Visualized, Prominent. Outline: Normal. Morphology: Appropriate. Size 51 mm x 30 mm x 30 mm Cyst(s)    1.  Size 26.0 mm x 17.0 mm x 18 mm. Mean 20.3 mm. Vol 4.166 cm. Simple cyst        2.  Size 40.0 mm x 27.0 mm x 37 mm. Mean 34.7 mm. Vol 20.923 cm. Complex ,   Cul de Sac =========   Visualized. Anechoic. Free fluid visualized: mild     Past Medical History:  has a past medical history of Acne, Allergic rhinitis, Anemia, Anxiety, COVID-19 (07/13/2020), Depression, Fatigue, GERD (gastroesophageal reflux disease), Hyperlipidemia, Insomnia, Motion sickness, Reflux, and Shoulder pain.  Past Surgical History:  has a past surgical history that includes Ovarian cyst resection (2012); Ex Lap RSO (2012); Left mini open rotator cuff repair, biceps tenodesis, subacromial decompression, extensice debridement of shoulder (glenohumeral and subacromial spaces) (Left, 12/07/2020); arthroscopic rotator cuff repair (Left, 06/15/2021); arthroscopy shoulder w/debridement (Left, 06/15/2021); arthroscopic subacromial decomp (Left, 06/15/2021); and transfer muscle shoulder/upper arm multiple (Left,  06/15/2021). Family History: family history includes Hyperlipidemia (Elevated cholesterol) in her father and mother; Pancreatic cancer in her father. Social History:  reports that she has never smoked. She has never used smokeless tobacco. She reports current alcohol use of about 1.0 standard drink of alcohol per week. She reports that she does not use drugs. OB/GYN History:  OB History       Gravida  3   Para  3   Term  3   Preterm      AB      Living  3        SAB      IAB      Ectopic      Molar      Multiple      Live Births  3             Allergies: is allergic to cyclobenzaprine. Medications:  Current Medications    Current Outpatient Medications:    CALCIUM ORAL, Take by mouth, Disp: , Rfl:    cetirizine (ZYRTEC) 10 mg capsule, Take 10 mg by mouth once daily., Disp: , Rfl:    cholecalciferol (VITAMIN D3) 1000 unit tablet, Take 2,500 Units by mouth once daily, Disp: , Rfl:    clonazePAM (KLONOPIN) 0.5 MG tablet, Take 1-2 tablets (0.5-1 mg total) by mouth at bedtime as needed for Anxiety, Disp: 10 tablet, Rfl: 0   cyanocobalamin (VITAMIN B12) 1000 MCG tablet, Take 1,000 mcg by mouth once daily, Disp: , Rfl:    FERROUS SULFATE ORAL, Take 1 tablet by mouth once daily   ,  Disp: , Rfl:    fluticasone (FLONASE) 50 mcg/actuation nasal spray, Place 2 sprays into both nostrils as needed   , Disp: , Rfl:    multivitamin tablet, Take 1 tablet by mouth once daily, Disp: , Rfl:    omeprazole (PRILOSEC) 20 MG DR capsule, Take 20 mg by mouth once daily, Disp: , Rfl:    RETIN-A 0.05 % cream, APPLY ONE PEA SIZED AMOUNT TO FACE EVERY NIGHT, Disp: , Rfl:    zinc 50 mg Tab, Take by mouth once daily, Disp: , Rfl:      Review of Systems: General:                      No fatigue or weight loss Eyes:                           No vision changes Ears:                            No hearing difficulty Respiratory:                No cough or shortness of breath Pulmonary:                   No asthma or shortness of breath Cardiovascular:           No chest pain, palpitations, dyspnea on exertion Gastrointestinal:          No abdominal bloating, chronic diarrhea, constipations, masses, pain or hematochezia Genitourinary:             No hematuria, dysuria, abnormal vaginal discharge, pelvic pain, Menometrorrhagia Lymphatic:                   No swollen lymph nodes Musculoskeletal:No muscle weakness Neurologic:                  No extremity weakness, syncope, seizure disorder Psychiatric:                  No history of depression, delusions or suicidal/homicidal ideation      Exam:       Vitals:    010/09/24 0905  BP: 109/70  Pulse: 75      Body mass index is 24.89 kg/m.   WDWN white/  female in NAD   Lungs: CTA  CV : RRR without murmur   Breast: exam done in sitting and lying position : No dimpling or retraction, no dominant mass, no spontaneous discharge, no axillary adenopathy Neck:  no thyromegaly Abdomen: soft , no mass, normal active bowel sounds,  non-tender, no rebound tenderness Pelvic: tanner stage 5 ,  External genitalia: vulva /labia no lesions Urethra: no prolapse Vagina: normal physiologic d/c Cervix: no lesions, no cervical motion tenderness   Uterus: normal size shape and contour, non-tender Adnexa: no mass,  non-tender   Rectovaginal:   Impression:    The primary encounter diagnosis was Complex cyst of left ovary. 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       Plan:    Spoke to her about possibility this is a dermoid especially with her prior h/o of dermoid .appearance is not classic for dermoid  She has elected to have ovaries removed   Risks discussed - see [facility] notes

## 2023-09-08 ENCOUNTER — Encounter
Admission: RE | Admit: 2023-09-08 | Discharge: 2023-09-08 | Disposition: A | Discharge status: Home / Self Care | Payer: Managed Care, Other (non HMO) | Source: Ambulatory Visit | Attending: Obstetrics and Gynecology | Admitting: Obstetrics and Gynecology

## 2023-09-08 ENCOUNTER — Other Ambulatory Visit: Payer: Self-pay

## 2023-09-08 HISTORY — DX: Other specified postprocedural states: Z98.890

## 2023-09-08 HISTORY — DX: Insomnia, unspecified: G47.00

## 2023-09-08 HISTORY — DX: Anemia, unspecified: D64.9

## 2023-09-08 HISTORY — DX: Pain in unspecified shoulder: M25.519

## 2023-09-08 HISTORY — DX: Other fatigue: R53.83

## 2023-09-08 HISTORY — DX: Depression, unspecified: F32.A

## 2023-09-08 HISTORY — DX: Allergic rhinitis, unspecified: J30.9

## 2023-09-08 HISTORY — DX: Anxiety disorder, unspecified: F41.9

## 2023-09-08 NOTE — Pre-Procedure Instructions (Deleted)
Patient signed consent for LEFT salpingectomy in office, but Epic consent states bilateral this needs to be corrected to LEFT salpingectomy in EPIC.

## 2023-09-08 NOTE — Patient Instructions (Addendum)
Your procedure is scheduled ZO:[id number] September 14, 2023. Report to the Registration Desk on the 1st floor of the [facility]. To find out your arrival time, please call [phone] between 1PM - 3PM on: Wednesday September 13, 2023. If your arrival time is 6:00 am, do not arrive before that time as the [facility] entrance doors do not open until 6:00 am.  REMEMBER: Instructions that are not followed completely may result in serious medical risk, up to and including death; or upon the discretion of your surgeon and anesthesiologist your surgery may need to be rescheduled.  Do not eat food after midnight the night before surgery.  No gum chewing or hard candies.  You may however, drink CLEAR liquids up to 2 hours before you are scheduled to arrive for your surgery. Do not drink anything within 2 hours of your scheduled arrival time.  Clear liquids include: - water  - apple juice without pulp - gatorade (not RED colors) - black coffee or tea (Do NOT add milk or creamers to the coffee or tea) Do NOT drink anything that is not on this list.   In addition, your doctor has ordered for you to drink the provided:  Ensure Pre-Surgery Clear Carbohydrate Drink  Drinking this carbohydrate drink up to two hours before surgery helps to reduce insulin resistance and improve patient outcomes. Please complete drinking 2 hours before scheduled arrival time.  One week prior to surgery: Stop Anti-inflammatories (NSAIDS) such as Advil, Aleve, Ibuprofen, Motrin, Naproxen, Naprosyn and Aspirin based products such as Excedrin, Goody's Powder, BC Powder. Stop ANY OVER THE COUNTER supplements until after surgery.  You may however, continue to take Tylenol if needed for pain up until the day of surgery.   Continue taking all of your other prescription medications up until the day of surgery.  ON THE DAY OF SURGERY ONLY TAKE THESE MEDICATIONS WITH SIPS OF WATER:  None   No Alcohol for 24 hours before  or after surgery.  No Smoking including e-cigarettes for 24 hours before surgery.  No chewable tobacco products for at least 6 hours before surgery.  No nicotine patches on the day of surgery.  Do not use any "recreational" drugs for at least a week (preferably 2 weeks) before your surgery.  Please be advised that the combination of cocaine and anesthesia may have negative outcomes, up to and including death. If you test positive for cocaine, your surgery will be cancelled.  On the morning of surgery brush your teeth with toothpaste and water, you may rinse your mouth with mouthwash if you wish. Do not swallow any toothpaste or mouthwash.  Use CHG Soap or wipes as directed on instruction sheet.  Do not wear jewelry, make-up, hairpins, clips or nail polish.  For welded (permanent) jewelry: bracelets, anklets, waist bands, etc.  Please have this removed prior to surgery.  If it is not removed, there is a chance that hospital personnel will need to cut it off on the day of surgery.  Do not wear lotions, powders, or perfumes.   Do not shave body hair from the neck down 48 hours before surgery.  Contact lenses, hearing aids and dentures may not be worn into surgery.  Do not bring valuables to the hospital. [facility] is not responsible for any missing/lost belongings or valuables.    Notify your doctor if there is any change in your medical condition (cold, fever, infection).  Wear comfortable clothing (specific to your surgery type) to the hospital.  After surgery, you can help prevent lung complications by doing breathing exercises.  Take deep breaths and cough every 1-2 hours. Your doctor may order a device called an Incentive Spirometer to help you take deep breaths. When coughing or sneezing, hold a pillow firmly against your incision with both hands. This is called "splinting." Doing this helps protect your incision. It also decreases belly discomfort.  If you are being admitted  to the hospital overnight, leave your suitcase in the car. After surgery it may be brought to your room.  In case of increased patient census, it may be necessary for you, the patient, to continue your postoperative care in the Same Day Surgery department.  If you are being discharged the day of surgery, you will not be allowed to drive home. You will need a responsible individual to drive you home and stay with you for 24 hours after surgery.   If you are taking public transportation, you will need to have a responsible individual with you.  Please call the Pre-admissions Testing Dept. at [phone] if you have any questions about these instructions.  Surgery Visitation Policy:  Patients having surgery or a procedure may have two visitors.  Children under the age of 61 must have an adult with them who is not the patient.  Inpatient Visitation:    Visiting hours are 7 a.m. to 8 p.m. Up to four visitors are allowed at one time in a patient room. The visitors may rotate out with other people during the day.  One visitor age 62 or older may stay with the patient overnight and must be in the room by 8 p.m.    Preparing for Surgery with CHLORHEXIDINE GLUCONATE (CHG) Soap  Chlorhexidine Gluconate (CHG) Soap  o An antiseptic cleaner that kills germs and bonds with the skin to continue killing germs even after washing  o Used for showering the night before surgery and morning of surgery  Before surgery, you can play an important role by reducing the number of germs on your skin.  CHG (Chlorhexidine gluconate) soap is an antiseptic cleanser which kills germs and bonds with the skin to continue killing germs even after washing.  Please do not use if you have an allergy to CHG or antibacterial soaps. If your skin becomes reddened/irritated stop using the CHG.  1. Shower the NIGHT BEFORE SURGERY and the MORNING OF SURGERY with CHG soap.  2. If you choose to wash your hair, wash your  hair first as usual with your normal shampoo.  3. After shampooing, rinse your hair and body thoroughly to remove the shampoo.  4. Use CHG as you would any other liquid soap. You can apply CHG directly to the skin and wash gently with a scrungie or a clean washcloth.  5. Apply the CHG soap to your body only from the neck down. Do not use on open wounds or open sores. Avoid contact with your eyes, ears, mouth, and genitals (private parts). Wash face and genitals (private parts) with your normal soap.  6. Wash thoroughly, paying special attention to the area where your surgery will be performed.  7. Thoroughly rinse your body with warm water.  8. Do not shower/wash with your normal soap after using and rinsing off the CHG soap.  9. Pat yourself dry with a clean towel.  10. Wear clean pajamas to bed the night before surgery.  12. Place clean sheets on your bed the night of your first shower and do not  sleep with pets.  13. Shower again with the CHG soap on the day of surgery prior to arriving at the hospital.  14. Do not apply any deodorants/lotions/powders.  15. Please wear clean clothes to the hospital.  How to Use an Incentive Spirometer  An incentive spirometer is a tool that measures how well you are filling your lungs with each breath. Learning to take long, deep breaths using this tool can help you keep your lungs clear and active. This may help to reverse or lessen your chance of developing breathing (pulmonary) problems, especially infection. You may be asked to use a spirometer: After a surgery. If you have a lung problem or a history of smoking. After a long period of time when you have been unable to move or be active. If the spirometer includes an indicator to show the highest number that you have reached, your health care provider or respiratory therapist will help you set a goal. Keep a log of your progress as told by your health care provider. What are the  risks? Breathing too quickly may cause dizziness or cause you to pass out. Take your time so you do not get dizzy or light-headed. If you are in pain, you may need to take pain medicine before doing incentive spirometry. It is harder to take a deep breath if you are having pain. How to use your incentive spirometer  Sit up on the edge of your bed or on a chair. Hold the incentive spirometer so that it is in an upright position. Before you use the spirometer, breathe out normally. Place the mouthpiece in your mouth. Make sure your lips are closed tightly around it. Breathe in slowly and as deeply as you can through your mouth, causing the piston or the ball to rise toward the top of the chamber. Hold your breath for 3-5 seconds, or for as long as possible. If the spirometer includes a coach indicator, use this to guide you in breathing. Slow down your breathing if the indicator goes above the marked areas. Remove the mouthpiece from your mouth and breathe out normally. The piston or ball will return to the bottom of the chamber. Rest for a few seconds, then repeat the steps 10 or more times. Take your time and take a few normal breaths between deep breaths so that you do not get dizzy or light-headed. Do this every 1-2 hours when you are awake. If the spirometer includes a goal marker to show the highest number you have reached (best effort), use this as a goal to work toward during each repetition. After each set of 10 deep breaths, cough a few times. This will help to make sure that your lungs are clear. If you have an incision on your chest or abdomen from surgery, place a pillow or a rolled-up towel firmly against the incision when you cough. This can help to reduce pain while taking deep breaths and coughing. General tips When you are able to get out of bed: Walk around often. Continue to take deep breaths and cough in order to clear your lungs. Keep using the incentive spirometer until  your health care provider says it is okay to stop using it. If you have been in the hospital, you may be told to keep using the spirometer at home. Contact a health care provider if: You are having difficulty using the spirometer. You have trouble using the spirometer as often as instructed. Your pain medicine is not giving enough relief  for you to use the spirometer as told. You have a fever. Get help right away if: You develop shortness of breath. You develop a cough with bloody mucus from the lungs. You have fluid or blood coming from an incision site after you cough. Summary An incentive spirometer is a tool that can help you learn to take long, deep breaths to keep your lungs clear and active. You may be asked to use a spirometer after a surgery, if you have a lung problem or a history of smoking, or if you have been inactive for a long period of time. Use your incentive spirometer as instructed every 1-2 hours while you are awake. If you have an incision on your chest or abdomen, place a pillow or a rolled-up towel firmly against your incision when you cough. This will help to reduce pain. Get help right away if you have shortness of breath, you cough up bloody mucus, or blood comes from your incision when you cough. This information is not intended to replace advice given to you by your health care provider. Make sure you discuss any questions you have with your health care provider. Document Revised: 02/03/2020 Document Reviewed: 02/03/2020 Elsevier Patient Education  2023 [employer].

## 2023-09-11 ENCOUNTER — Encounter
Admission: RE | Admit: 2023-09-11 | Discharge: 2023-09-11 | Disposition: A | Discharge status: Home / Self Care | Payer: Managed Care, Other (non HMO) | Source: Ambulatory Visit | Attending: Obstetrics and Gynecology | Admitting: Obstetrics and Gynecology

## 2023-09-11 DIAGNOSIS — Z01812 Encounter for preprocedural laboratory examination: Secondary | ICD-10-CM | POA: Insufficient documentation

## 2023-09-11 DIAGNOSIS — Z01818 Encounter for other preprocedural examination: Secondary | ICD-10-CM

## 2023-09-11 LAB — TYPE AND SCREEN
ABO/RH(D): O POS
Antibody Screen: NEGATIVE

## 2023-09-11 LAB — BASIC METABOLIC PANEL
Anion gap: 10 (ref 5–15)
BUN: 11 mg/dL (ref 6–20)
CO2: 26 mmol/L (ref 22–32)
Calcium: 8.7 mg/dL — ABNORMAL LOW (ref 8.9–10.3)
Chloride: 102 mmol/L (ref 98–111)
Creatinine, Ser: 0.7 mg/dL (ref 0.44–1.00)
GFR, Estimated: >60 mL/min (ref >60)
Glucose, Bld: 87 mg/dL (ref 70–99)
Potassium: 3.5 mmol/L (ref 3.5–5.1)
Sodium: 138 mmol/L (ref 135–145)

## 2023-09-11 LAB — CBC
HCT: 34.2 % — ABNORMAL LOW (ref 36.0–46.0)
Hemoglobin: 11.9 g/dL — ABNORMAL LOW (ref 12.0–15.0)
MCH: 29.8 pg (ref 26.0–34.0)
MCHC: 34.8 g/dL (ref 30.0–36.0)
MCV: 85.7 fL (ref 80.0–100.0)
Platelets: 198 10*3/uL (ref 150–400)
RBC: 3.99 MIL/uL (ref 3.87–5.11)
RDW: 13.2 % (ref 11.5–15.5)
WBC: 3.8 10*3/uL — ABNORMAL LOW (ref 4.0–10.5)
nRBC: 0 % (ref 0.0–0.2)

## 2023-09-14 ENCOUNTER — Other Ambulatory Visit: Payer: Self-pay

## 2023-09-14 ENCOUNTER — Encounter
Admission: RE | Disposition: A | Discharge status: Home / Self Care | Payer: Self-pay | Source: Home / Self Care | Attending: Obstetrics and Gynecology

## 2023-09-14 ENCOUNTER — Ambulatory Visit
Admission: RE | Admit: 2023-09-14 | Discharge: 2023-09-14 | Disposition: A | Discharge status: Home / Self Care | Payer: Managed Care, Other (non HMO) | Attending: Obstetrics and Gynecology | Admitting: Obstetrics and Gynecology

## 2023-09-14 ENCOUNTER — Ambulatory Visit: Payer: Managed Care, Other (non HMO) | Admitting: Urgent Care

## 2023-09-14 ENCOUNTER — Encounter: Payer: Self-pay | Admitting: Obstetrics and Gynecology

## 2023-09-14 DIAGNOSIS — F32A Depression, unspecified: Secondary | ICD-10-CM | POA: Diagnosis not present

## 2023-09-14 DIAGNOSIS — F419 Anxiety disorder, unspecified: Secondary | ICD-10-CM | POA: Diagnosis not present

## 2023-09-14 DIAGNOSIS — R519 Headache, unspecified: Secondary | ICD-10-CM | POA: Diagnosis not present

## 2023-09-14 DIAGNOSIS — D271 Benign neoplasm of left ovary: Secondary | ICD-10-CM | POA: Insufficient documentation

## 2023-09-14 DIAGNOSIS — Z79899 Other long term (current) drug therapy: Secondary | ICD-10-CM | POA: Insufficient documentation

## 2023-09-14 DIAGNOSIS — N736 Female pelvic peritoneal adhesions (postinfective): Secondary | ICD-10-CM | POA: Diagnosis not present

## 2023-09-14 DIAGNOSIS — N83292 Other ovarian cyst, left side: Secondary | ICD-10-CM | POA: Diagnosis present

## 2023-09-14 DIAGNOSIS — Z90721 Acquired absence of ovaries, unilateral: Secondary | ICD-10-CM | POA: Insufficient documentation

## 2023-09-14 DIAGNOSIS — E785 Hyperlipidemia, unspecified: Secondary | ICD-10-CM | POA: Insufficient documentation

## 2023-09-14 DIAGNOSIS — K219 Gastro-esophageal reflux disease without esophagitis: Secondary | ICD-10-CM | POA: Diagnosis not present

## 2023-09-14 DIAGNOSIS — N838 Other noninflammatory disorders of ovary, fallopian tube and broad ligament: Secondary | ICD-10-CM | POA: Diagnosis not present

## 2023-09-14 DIAGNOSIS — Z01818 Encounter for other preprocedural examination: Secondary | ICD-10-CM

## 2023-09-14 HISTORY — PX: LAPAROSCOPIC UNILATERAL SALPINGO OOPHERECTOMY: SHX5935

## 2023-09-14 LAB — POCT PREGNANCY, URINE: Preg Test, Ur: NEGATIVE

## 2023-09-14 LAB — ABO/RH: ABO/RH(D): O POS

## 2023-09-14 SURGERY — SALPINGO-OOPHORECTOMY, UNILATERAL, LAPAROSCOPIC
Anesthesia: General | Site: Abdomen | Laterality: Left

## 2023-09-14 MED ORDER — ONDANSETRON HCL 4 MG/2ML IJ SOLN
4.0000 mg | Freq: Once | INTRAMUSCULAR | Status: AC | PRN
  Administered 2023-09-14: 4 mg via INTRAVENOUS

## 2023-09-14 MED ORDER — PROPOFOL 1000 MG/100ML IV EMUL
INTRAVENOUS | Status: AC
  Filled 2023-09-14: qty 100

## 2023-09-14 MED ORDER — 0.9 % SODIUM CHLORIDE (POUR BTL) OPTIME
TOPICAL | Status: DC | PRN
  Administered 2023-09-14: 500 mL

## 2023-09-14 MED ORDER — OXYCODONE HCL 5 MG PO TABS
5.0000 mg | ORAL_TABLET | Freq: Once | ORAL | Status: AC | PRN
  Administered 2023-09-14: 5 mg via ORAL

## 2023-09-14 MED ORDER — CHLORHEXIDINE GLUCONATE 0.12 % MT SOLN
OROMUCOSAL | Status: AC
  Filled 2023-09-14: qty 15

## 2023-09-14 MED ORDER — POVIDONE-IODINE 10 % EX SWAB: 2.0000 | Freq: Once | CUTANEOUS | Status: DC

## 2023-09-14 MED ORDER — DEXAMETHASONE SODIUM PHOSPHATE 10 MG/ML IJ SOLN
INTRAMUSCULAR | Status: DC | PRN
  Administered 2023-09-14: 10 mg via INTRAVENOUS

## 2023-09-14 MED ORDER — ACETAMINOPHEN 500 MG PO TABS
1000.0000 mg | ORAL_TABLET | ORAL | Status: AC
  Administered 2023-09-14: 1000 mg via ORAL

## 2023-09-14 MED ORDER — LACTATED RINGERS IV SOLN: INTRAVENOUS | Status: AC

## 2023-09-14 MED ORDER — PROPOFOL 10 MG/ML IV BOLUS
INTRAVENOUS | Status: DC | PRN
  Administered 2023-09-14: 200 mg via INTRAVENOUS

## 2023-09-14 MED ORDER — OXYCODONE-ACETAMINOPHEN 5-325 MG PO TABS: 1.0000 | ORAL_TABLET | ORAL | Status: DC | PRN

## 2023-09-14 MED ORDER — PHENYLEPHRINE 80 MCG/ML (10ML) SYRINGE FOR IV PUSH (FOR BLOOD PRESSURE SUPPORT)
PREFILLED_SYRINGE | INTRAVENOUS | Status: DC | PRN
  Administered 2023-09-14: 160 ug via INTRAVENOUS
  Administered 2023-09-14 (×2): 80 ug via INTRAVENOUS

## 2023-09-14 MED ORDER — MIDAZOLAM HCL 2 MG/2ML IJ SOLN
INTRAMUSCULAR | Status: AC
  Filled 2023-09-14: qty 2

## 2023-09-14 MED ORDER — SUCCINYLCHOLINE CHLORIDE 200 MG/10ML IV SOSY
PREFILLED_SYRINGE | INTRAVENOUS | Status: DC | PRN
  Administered 2023-09-14: 80 mg via INTRAVENOUS

## 2023-09-14 MED ORDER — FENTANYL CITRATE (PF) 100 MCG/2ML IJ SOLN
INTRAMUSCULAR | Status: AC
  Filled 2023-09-14: qty 2

## 2023-09-14 MED ORDER — OXYCODONE HCL 5 MG/5ML PO SOLN: 5.0000 mg | Freq: Once | ORAL | Status: AC | PRN

## 2023-09-14 MED ORDER — KETOROLAC TROMETHAMINE 30 MG/ML IJ SOLN
INTRAMUSCULAR | Status: DC | PRN
Start: 2023-09-14 — End: 2023-09-14
  Administered 2023-09-14: 30 mg via INTRAVENOUS

## 2023-09-14 MED ORDER — VASOPRESSIN 20 UNIT/ML IV SOLN
INTRAVENOUS | Status: DC | PRN
Start: 2023-09-14 — End: 2023-09-14
  Administered 2023-09-14 (×3): 2 [IU] via INTRAVENOUS

## 2023-09-14 MED ORDER — GLYCOPYRROLATE 0.2 MG/ML IJ SOLN
INTRAMUSCULAR | Status: DC | PRN
  Administered 2023-09-14: .2 mg via INTRAVENOUS

## 2023-09-14 MED ORDER — FENTANYL CITRATE (PF) 100 MCG/2ML IJ SOLN
INTRAMUSCULAR | Status: DC | PRN
  Administered 2023-09-14 (×2): 50 ug via INTRAVENOUS

## 2023-09-14 MED ORDER — SCOPOLAMINE 1 MG/3DAYS TD PT72
MEDICATED_PATCH | TRANSDERMAL | Status: AC
  Filled 2023-09-14: qty 1

## 2023-09-14 MED ORDER — BUPIVACAINE HCL 0.5 % IJ SOLN
INTRAMUSCULAR | Status: DC | PRN
  Administered 2023-09-14: 10 mL

## 2023-09-14 MED ORDER — ACETAMINOPHEN 10 MG/ML IV SOLN: 1000.0000 mg | Freq: Once | INTRAVENOUS | Status: DC | PRN

## 2023-09-14 MED ORDER — OXYCODONE HCL 5 MG PO TABS
ORAL_TABLET | ORAL | Status: AC
  Filled 2023-09-14: qty 1

## 2023-09-14 MED ORDER — CHLORHEXIDINE GLUCONATE 0.12 % MT SOLN
15.0000 mL | Freq: Once | OROMUCOSAL | Status: AC
  Administered 2023-09-14: 15 mL via OROMUCOSAL

## 2023-09-14 MED ORDER — DEXMEDETOMIDINE HCL IN NACL 80 MCG/20ML IV SOLN
INTRAVENOUS | Status: DC | PRN
Start: 2023-09-14 — End: 2023-09-14
  Administered 2023-09-14: 8 ug via INTRAVENOUS

## 2023-09-14 MED ORDER — ROCURONIUM BROMIDE 100 MG/10ML IV SOLN
INTRAVENOUS | Status: DC | PRN
  Administered 2023-09-14: 50 mg via INTRAVENOUS
  Administered 2023-09-14: 20 mg via INTRAVENOUS

## 2023-09-14 MED ORDER — ACETAMINOPHEN 500 MG PO TABS
ORAL_TABLET | ORAL | Status: AC
  Filled 2023-09-14: qty 2

## 2023-09-14 MED ORDER — SUGAMMADEX SODIUM 200 MG/2ML IV SOLN
INTRAVENOUS | Status: DC | PRN
  Administered 2023-09-14: 200 mg via INTRAVENOUS

## 2023-09-14 MED ORDER — LIDOCAINE HCL (CARDIAC) PF 100 MG/5ML IV SOSY
PREFILLED_SYRINGE | INTRAVENOUS | Status: DC | PRN
  Administered 2023-09-14: 100 mg via INTRAVENOUS

## 2023-09-14 MED ORDER — GABAPENTIN 300 MG PO CAPS
ORAL_CAPSULE | ORAL | Status: AC
  Filled 2023-09-14: qty 1

## 2023-09-14 MED ORDER — PROPOFOL 500 MG/50ML IV EMUL
INTRAVENOUS | Status: DC | PRN
Start: 2023-09-14 — End: 2023-09-14
  Administered 2023-09-14: 165 ug/kg/min via INTRAVENOUS

## 2023-09-14 MED ORDER — SCOPOLAMINE 1 MG/3DAYS TD PT72
1.0000 | MEDICATED_PATCH | TRANSDERMAL | Status: DC
  Administered 2023-09-14: 1.5 mg via TRANSDERMAL

## 2023-09-14 MED ORDER — BUPIVACAINE HCL (PF) 0.5 % IJ SOLN
INTRAMUSCULAR | Status: AC
  Filled 2023-09-14: qty 30

## 2023-09-14 MED ORDER — SILVER NITRATE-POT NITRATE 75-25 % EX MISC
CUTANEOUS | Status: AC
  Filled 2023-09-14: qty 10

## 2023-09-14 MED ORDER — MIDAZOLAM HCL 2 MG/2ML IJ SOLN
INTRAMUSCULAR | Status: DC | PRN
  Administered 2023-09-14: 2 mg via INTRAVENOUS

## 2023-09-14 MED ORDER — SODIUM CHLORIDE 0.9% FLUSH: 10.0000 mL | Freq: Two times a day (BID) | INTRAVENOUS | Status: DC

## 2023-09-14 MED ORDER — GABAPENTIN 300 MG PO CAPS
300.0000 mg | ORAL_CAPSULE | ORAL | Status: AC
  Administered 2023-09-14: 300 mg via ORAL

## 2023-09-14 MED ORDER — ONDANSETRON HCL 4 MG/2ML IJ SOLN
INTRAMUSCULAR | Status: AC
  Filled 2023-09-14: qty 2

## 2023-09-14 MED ORDER — PHENYLEPHRINE HCL-NACL 20-0.9 MG/250ML-% IV SOLN
INTRAVENOUS | Status: DC | PRN
Start: 2023-09-14 — End: 2023-09-14
  Administered 2023-09-14: 15 ug/min via INTRAVENOUS

## 2023-09-14 MED ORDER — ORAL CARE MOUTH RINSE: 15.0000 mL | Freq: Once | OROMUCOSAL | Status: AC

## 2023-09-14 MED ORDER — ONDANSETRON HCL 4 MG/2ML IJ SOLN
INTRAMUSCULAR | Status: DC | PRN
  Administered 2023-09-14 (×2): 4 mg via INTRAVENOUS

## 2023-09-14 MED ORDER — LACTATED RINGERS IV SOLN: INTRAVENOUS | Status: DC

## 2023-09-14 MED ORDER — ONDANSETRON 4 MG PO TBDP: 4.0000 mg | ORAL_TABLET | Freq: Four times a day (QID) | ORAL | Status: DC | PRN

## 2023-09-14 MED ORDER — SILVER NITRATE-POT NITRATE 75-25 % EX MISC
CUTANEOUS | Status: DC | PRN
Start: 2023-09-14 — End: 2023-09-14
  Administered 2023-09-14: 1

## 2023-09-14 MED ORDER — FENTANYL CITRATE (PF) 100 MCG/2ML IJ SOLN
25.0000 ug | INTRAMUSCULAR | Status: DC | PRN
  Administered 2023-09-14 (×2): 25 ug via INTRAVENOUS

## 2023-09-14 SURGICAL SUPPLY — 53 items
APL PRP STRL LF DISP 70% ISPRP (MISCELLANEOUS)
BAG DRN RND TRDRP ANRFLXCHMBR (UROLOGICAL SUPPLIES)
BAG URINE DRAIN 2000ML AR STRL (UROLOGICAL SUPPLIES) IMPLANT
BLADE SURG SZ11 CARB STEEL (BLADE) ×1 IMPLANT
CATH FOLEY 2WAY 5CC 16FR (CATHETERS) ×1
CATH ROBINSON RED A/P 16FR (CATHETERS) ×1 IMPLANT
CATH URTH 16FR FL 2W BLN LF (CATHETERS) ×1 IMPLANT
CHLORAPREP W/TINT 26 (MISCELLANEOUS) ×1 IMPLANT
DRSG TEGADERM 2-3/8X2-3/4 SM (GAUZE/BANDAGES/DRESSINGS) ×1 IMPLANT
GAUZE 4X4 16PLY ~~LOC~~+RFID DBL (SPONGE) ×1 IMPLANT
GAUZE SPONGE 2X2 STRL 8-PLY (GAUZE/BANDAGES/DRESSINGS) ×1 IMPLANT
GLOVE BIOGEL PI IND STRL 7.5 (GLOVE) ×1 IMPLANT
GLOVE SURG SYN 7.0 (GLOVE) ×1
GLOVE SURG SYN 7.0 PF PI (GLOVE) ×1 IMPLANT
GLOVE SURG SYN 8.0 (GLOVE) ×2
GLOVE SURG SYN 8.0 PF PI (GLOVE) ×2 IMPLANT
GOWN STRL REUS W/ TWL LRG LVL3 (GOWN DISPOSABLE) ×1 IMPLANT
GOWN STRL REUS W/ TWL XL LVL3 (GOWN DISPOSABLE) ×2 IMPLANT
GOWN STRL REUS W/TWL LRG LVL3 (GOWN DISPOSABLE) ×1
GOWN STRL REUS W/TWL XL LVL3 (GOWN DISPOSABLE) ×2
GRASPER SUT TROCAR 14GX15 (MISCELLANEOUS) ×1 IMPLANT
IRRIGATION STRYKERFLOW (MISCELLANEOUS) ×1 IMPLANT
IRRIGATOR STRYKERFLOW (MISCELLANEOUS)
IV NS 1000ML (IV SOLUTION)
IV NS 1000ML BAXH (IV SOLUTION) ×1 IMPLANT
KIT PINK PAD W/HEAD ARE REST (MISCELLANEOUS) ×1
KIT PINK PAD W/HEAD ARM REST (MISCELLANEOUS) ×1 IMPLANT
KIT TURNOVER CYSTO (KITS) ×1 IMPLANT
KITTNER LAPARASCOPIC 5X40 (MISCELLANEOUS) IMPLANT
LABEL OR SOLS (LABEL) ×1 IMPLANT
MANIFOLD NEPTUNE II (INSTRUMENTS) ×1 IMPLANT
NS IRRIG 500ML POUR BTL (IV SOLUTION) ×1 IMPLANT
PACK GYN LAPAROSCOPIC (MISCELLANEOUS) ×1 IMPLANT
PAD OB MATERNITY 4.3X12.25 (PERSONAL CARE ITEMS) ×1 IMPLANT
PAD PREP OB/GYN DISP 24X41 (PERSONAL CARE ITEMS) ×1 IMPLANT
SCISSORS METZENBAUM CVD 33 (INSTRUMENTS) IMPLANT
SCRUB CHG 4% DYNA-HEX 4OZ (MISCELLANEOUS) ×1 IMPLANT
SET TUBE SMOKE EVAC HIGH FLOW (TUBING) ×1 IMPLANT
SHEARS HARMONIC 36 ACE (MISCELLANEOUS) ×1 IMPLANT
SLEEVE Z-THREAD 5X100MM (TROCAR) ×1 IMPLANT
STRIP CLOSURE SKIN 1/4X4 (GAUZE/BANDAGES/DRESSINGS) ×1 IMPLANT
SUT VIC AB 0 CT1 36 (SUTURE) ×1 IMPLANT
SUT VIC AB 2-0 UR6 27 (SUTURE) ×1 IMPLANT
SUT VIC AB 4-0 SH 27 (SUTURE) ×2
SUT VIC AB 4-0 SH 27XANBCTRL (SUTURE) ×1 IMPLANT
SWABSTK COMLB BENZOIN TINCTURE (MISCELLANEOUS) ×1 IMPLANT
SYR 50ML LL SCALE MARK (SYRINGE) IMPLANT
SYS BAG RETRIEVAL 10MM (BASKET) ×1
SYSTEM BAG RETRIEVAL 10MM (BASKET) ×1 IMPLANT
TRAP FLUID SMOKE EVACUATOR (MISCELLANEOUS) ×1 IMPLANT
TROCAR Z-THRD FIOS HNDL 11X100 (TROCAR) ×1 IMPLANT
TROCAR Z-THREAD FIOS 5X100MM (TROCAR) ×1 IMPLANT
WATER STERILE IRR 500ML POUR (IV SOLUTION) ×1 IMPLANT

## 2023-09-14 NOTE — Progress Notes (Signed)
Ready for L/S LSO for complex cyst . NPO . Neg HCG . All questions answered . Proceed

## 2023-09-14 NOTE — Op Note (Signed)
[name], Rodriguez MEDICAL RECORD NO: [id number] ACCOUNT NO: [id number] DATE OF BIRTH: 23-Mar-1971 FACILITY: [facility] LOCATION: [facility] PHYSICIAN: Suzy Bouchard, MD  Operative Report   DATE OF PROCEDURE: 09/14/2023  PREOPERATIVE DIAGNOSIS:  Complex left ovarian cyst.  POSTOPERATIVE DIAGNOSIS:   1.  Complex left ovarian cyst. 2.  Pelvic adhesions.  PROCEDURE: 1.  Laparoscopic left salpingo-oophorectomy. 2.  Lysis of adhesions.  SURGEON:  Suzy Bouchard, MD  FIRST ASSISTANT:  Kathalene Frames, MD  INDICATIONS:  A 52 year old female with a prior history of a right dermoid cyst, status post right salpingo-oophorectomy in 2012.  The patient with a complex left ovarian cyst changing slightly in size, possibly consistent with a dermoid on the left  ovary.  DESCRIPTION OF PROCEDURE:  After adequate general endotracheal anesthesia, the patient was placed in dorsal supine position with legs placed in the [place] stirrups.  The patient's abdomen, perineum and vagina were prepped and draped in normal sterile  fashion.  Timeout was performed.  A Foley catheter was placed in the bladder to drain the bladder during the procedure.  Sponge stick was placed in the vagina for uterine manipulation during the procedure.  Gloves and gown were changed.  Attention was  directed to the patient's abdomen where a 5 mm infraumbilical incision was made after injecting with 0.5% Marcaine the 5 mm laparoscope was then advanced into the abdominal cavity under direct visualization with the Optiview cannula.  The patient's  abdomen was insufflated.  Initial impression revealed dense adhesion in the midportion of the abdominal cavity.  A second port placement was placed left lower quadrant, 3 cm medial to the left anterior iliac spine and an 11 mm trocar was advanced under  direct visualization.  Third port placement was placed in the right lower quadrant, 3 cm medial to the right anterior  iliac spine, a 5 mm trocar was advanced under direct visualization.  The Harmonic scalpel was brought up and the dense central adhesion  was removed without difficulty.  Attention was directed to the patient's left ovary and fallopian tube.  Ovary appeared irregularly shaped 4 cm in size.  The infundibulopelvic ligament was cauterized with the Kleppinger cautery and Harmonic was used to  dissect the left ovary and fallopian tube free from the adnexa.  Prior to this, there were some colonic adhesions adhesed to the left sidewall, which was taken down bluntly and with Harmonic scalpel.  Good hemostasis noted at the adnexal site.  Posterior  fibroid noted.  There was some bowel adhesion to the prior surgery site to the right adnexa, which seemed close to the proximal fallopian tube and given that it was mobile, it was not removed inferior of causing more injury to the bowel.   Intra-abdominal pressure was lowered to 7 mmHg.  Good hemostasis noted and the patient's abdomen was then deflated.  The left lower port site was closed with two separate sutures using the Carter-Thomason cone and PMI with good closure of the fascia.   The patient's abdomen was deflated and all skin incisions were closed with interrupted 4-0 Vicryl suture.  Sterile dressing applied.  Foley removed.  Sponge stick removed.  There were no complications.  ESTIMATED BLOOD LOSS:  Minimal.  INTRAOPERATIVE FLUIDS:  500 mL  URINE OUTPUT:  100 mL.  The patient tolerated the procedure well and was taken to recovery room in good condition.   PUS D: 09/14/2023 9:25:48 am T: 09/14/2023 9:57:00 am  JOB: [id number]/ [id number]

## 2023-09-14 NOTE — Discharge Instructions (Addendum)
AMBULATORY SURGERY  DISCHARGE INSTRUCTIONS   The drugs that you were given will stay in your system until tomorrow so for the next 24 hours you should not:  Drive an automobile Make any legal decisions Drink any alcoholic beverage   You may resume regular meals tomorrow.  Today it is better to start with liquids and gradually work up to solid foods.  You may eat anything you prefer, but it is better to start with liquids, then soup and crackers, and gradually work up to solid foods.   Please notify your doctor immediately if you have any unusual bleeding, trouble breathing, redness and pain at the surgery site, drainage, fever, or pain not relieved by medication.    Additional Instructions:   WAIT UNTIL 3 PM TO TAKE MOTRIN/IBUPROFEN.  MAY TAKE YOUR PERCOCET ABOUT 1230.  MAY TAKE ZOFRAN AT ABOUT 430 PM.     Please contact your physician with any problems or Same Day Surgery at [phone], Monday through Friday 6 am to 4 pm, or [facility] number at [phone].

## 2023-09-14 NOTE — Anesthesia Procedure Notes (Signed)
Procedure Name: Intubation Date/Time: 09/14/2023 7:39 AM  Performed by: Mohammed Kindle, CRNAPre-anesthesia Checklist: Patient identified, Emergency Drugs available, Suction available and Patient being monitored Patient Re-evaluated:Patient Re-evaluated prior to induction Oxygen Delivery Method: Circle system utilized Preoxygenation: Pre-oxygenation with 100% oxygen Induction Type: IV induction Ventilation: Mask ventilation without difficulty Laryngoscope Size: McGraph and 3 Grade View: Grade I Tube type: Oral Tube size: 6.5 mm Number of attempts: 1 Airway Equipment and Method: Stylet Placement Confirmation: ETT inserted through vocal cords under direct vision, positive ETCO2, breath sounds checked- equal and bilateral and CO2 detector Secured at: 21 cm Tube secured with: Tape Dental Injury: Teeth and Oropharynx as per pre-operative assessment

## 2023-09-14 NOTE — Brief Op Note (Signed)
09/14/2023  8:54 AM  PATIENT:  Teresa Rodriguez  52 y.o. female  PRE-OPERATIVE DIAGNOSIS:  complex left ovarian cyst  POST-OPERATIVE DIAGNOSIS:  complex left ovarian cyst Pelvic adhesions PROCEDURE:  Procedure(s): LAPAROSCOPIC UNILATERAL SALPINGO OOPHORECTOMY (Left)  SURGEON:  Surgeons and Role:    * Anda Sobotta, Ihor Austin, MD - Primary    * Jenita Rayfield, Festus Holts, MD - Assisting  PHYSICIAN ASSISTANT:   ASSISTANTS: none   ANESTHESIA:   general  EBL:    IOF 500 cc EBL minimal  UO 100 cc  BLOOD ADMINISTERED:none  DRAINS: none   LOCAL MEDICATIONS USED:  MARCAINE     SPECIMEN:  Source of Specimen:  left tube and ovary  DISPOSITION OF SPECIMEN:  PATHOLOGY  COUNTS:  YES  TOURNIQUET:no  DICTATION: .Other Dictation: Dictation Number verbal  PLAN OF CARE: Discharge to home after PACU  PATIENT DISPOSITION:  PACU - hemodynamically stable.   Delay start of Pharmacological VTE agent (>24hrs) due to surgical blood loss or risk of bleeding: not applicable

## 2023-09-14 NOTE — Transfer of Care (Signed)
Immediate Anesthesia Transfer of Care Note  Patient: Teresa Rodriguez  Procedure(s) Performed: LAPAROSCOPIC UNILATERAL SALPINGO OOPHORECTOMY (Left: Abdomen)  Patient Location: PACU  Anesthesia Type:General  Level of Consciousness: awake, drowsy, and patient cooperative  Airway & Oxygen Therapy: Patient Spontanous Breathing  Post-op Assessment: Report given to RN and Post -op Vital signs reviewed and stable  Post vital signs: Reviewed and stable  Last Vitals:  Vitals Value Taken Time  BP 93/46 09/14/23 0940  Temp 36.2 C 09/14/23 0940  Pulse 88 09/14/23 0940  Resp 18 09/14/23 0940  SpO2 100 % 09/14/23 0940    Last Pain:  Vitals:   09/14/23 0940  TempSrc: Temporal  PainSc: 4          Complications: No notable events documented.

## 2023-09-14 NOTE — Anesthesia Preprocedure Evaluation (Addendum)
Anesthesia Evaluation  Patient identified by MRN, date of birth, ID band Patient awake    Reviewed: Allergy & Precautions, NPO status , Patient's Chart, lab work & pertinent test results  History of Anesthesia Complications (+) PONV and history of anesthetic complications  Airway Mallampati: III   Neck ROM: Full    Dental no notable dental hx.    Pulmonary neg pulmonary ROS   Pulmonary exam normal breath sounds clear to auscultation       Cardiovascular Exercise Tolerance: Good negative cardio ROS Normal cardiovascular exam Rhythm:Regular Rate:Normal     Neuro/Psych  Headaches PSYCHIATRIC DISORDERS Anxiety Depression       GI/Hepatic ,GERD  ,,  Endo/Other  negative endocrine ROS    Renal/GU negative Renal ROS     Musculoskeletal   Abdominal   Peds  Hematology negative hematology ROS (+)   Anesthesia Other Findings   Reproductive/Obstetrics                             Anesthesia Physical Anesthesia Plan  ASA: 2  Anesthesia Plan: General   Post-op Pain Management:    Induction: Intravenous  PONV Risk Score and Plan: 4 or greater and Ondansetron, Dexamethasone, Treatment may vary due to age or medical condition and Scopolamine patch - Pre-op  Airway Management Planned: Oral ETT  Additional Equipment:   Intra-op Plan:   Post-operative Plan: Extubation in OR  Informed Consent: I have reviewed the patients History and Physical, chart, labs and discussed the procedure including the risks, benefits and alternatives for the proposed anesthesia with the patient or authorized representative who has indicated his/her understanding and acceptance.     Dental advisory given  Plan Discussed with: CRNA  Anesthesia Plan Comments: (Patient consented for risks of anesthesia including but not limited to:  - adverse reactions to medications - damage to eyes, teeth, lips or other oral  mucosa - nerve damage due to positioning  - sore throat or hoarseness - damage to heart, brain, nerves, lungs, other parts of body or loss of life  Informed patient about role of CRNA in peri- and intra-operative care.  Patient voiced understanding.)       Anesthesia Quick Evaluation

## 2023-09-15 ENCOUNTER — Encounter: Payer: Self-pay | Admitting: Obstetrics and Gynecology

## 2023-09-15 LAB — SURGICAL PATHOLOGY

## 2023-09-15 NOTE — Anesthesia Postprocedure Evaluation (Signed)
Anesthesia Post Note  Patient: Teresa Rodriguez  Procedure(s) Performed: LAPAROSCOPIC UNILATERAL SALPINGO OOPHORECTOMY (Left: Abdomen)  Patient location during evaluation: PACU Anesthesia Type: General Level of consciousness: awake and alert, oriented and patient cooperative Pain management: pain level controlled Vital Signs Assessment: post-procedure vital signs reviewed and stable Respiratory status: spontaneous breathing, nonlabored ventilation and respiratory function stable Cardiovascular status: blood pressure returned to baseline and stable Postop Assessment: adequate PO intake Anesthetic complications: no   No notable events documented.   Last Vitals:  Vitals:   09/14/23 0940 09/14/23 1056  BP: (!) 93/46 (!) 101/55  Pulse: 88 68  Resp: 18   Temp: (!) 36.2 C   SpO2: 100% 100%    Last Pain:  Vitals:   09/14/23 0940  TempSrc: Temporal  PainSc: 4                  Reed Breech

## 2023-09-17 NOTE — Plan of Care (Signed)
CHL Tonsillectomy/Adenoidectomy, Postoperative PEDS care plan entered in error.
